# Patient Record
Sex: Female | Born: 1935 | Race: Black or African American | Hispanic: No | Smoking: Never smoker
Health system: Southern US, Community
[De-identification: ages and names within clinical notes are randomized; demographics above are authoritative.]

## PROBLEM LIST (undated history)

## (undated) DIAGNOSIS — H9190 Unspecified hearing loss, unspecified ear: Secondary | ICD-10-CM

## (undated) DIAGNOSIS — E039 Hypothyroidism, unspecified: Secondary | ICD-10-CM

## (undated) DIAGNOSIS — M810 Age-related osteoporosis without current pathological fracture: Secondary | ICD-10-CM

## (undated) DIAGNOSIS — E785 Hyperlipidemia, unspecified: Secondary | ICD-10-CM

## (undated) DIAGNOSIS — I441 Atrioventricular block, second degree: Secondary | ICD-10-CM

## (undated) DIAGNOSIS — M1990 Unspecified osteoarthritis, unspecified site: Secondary | ICD-10-CM

## (undated) DIAGNOSIS — R269 Unspecified abnormalities of gait and mobility: Secondary | ICD-10-CM

## (undated) HISTORY — DX: Unspecified abnormalities of gait and mobility: R26.9

## (undated) HISTORY — DX: Hypothyroidism, unspecified: E03.9

## (undated) HISTORY — DX: Age-related osteoporosis without current pathological fracture: M81.0

## (undated) HISTORY — DX: Unspecified osteoarthritis, unspecified site: M19.90

## (undated) HISTORY — DX: Unspecified hearing loss, unspecified ear: H91.90

## (undated) HISTORY — DX: Hyperlipidemia, unspecified: E78.5

---

## 2013-05-22 ENCOUNTER — Non-Acute Institutional Stay (SKILLED_NURSING_FACILITY): Payer: 59 | Admitting: Internal Medicine

## 2013-05-22 ENCOUNTER — Encounter: Payer: Self-pay | Admitting: Internal Medicine

## 2013-05-22 DIAGNOSIS — M064 Inflammatory polyarthropathy: Secondary | ICD-10-CM

## 2013-05-22 DIAGNOSIS — F32A Depression, unspecified: Secondary | ICD-10-CM | POA: Insufficient documentation

## 2013-05-22 DIAGNOSIS — R269 Unspecified abnormalities of gait and mobility: Secondary | ICD-10-CM

## 2013-05-22 DIAGNOSIS — K219 Gastro-esophageal reflux disease without esophagitis: Secondary | ICD-10-CM

## 2013-05-22 DIAGNOSIS — E039 Hypothyroidism, unspecified: Secondary | ICD-10-CM

## 2013-05-22 DIAGNOSIS — M1990 Unspecified osteoarthritis, unspecified site: Secondary | ICD-10-CM

## 2013-05-22 DIAGNOSIS — M810 Age-related osteoporosis without current pathological fracture: Secondary | ICD-10-CM

## 2013-05-22 DIAGNOSIS — H9190 Unspecified hearing loss, unspecified ear: Secondary | ICD-10-CM | POA: Insufficient documentation

## 2013-05-22 DIAGNOSIS — F3289 Other specified depressive episodes: Secondary | ICD-10-CM

## 2013-05-22 DIAGNOSIS — F329 Major depressive disorder, single episode, unspecified: Secondary | ICD-10-CM

## 2013-05-22 DIAGNOSIS — E785 Hyperlipidemia, unspecified: Secondary | ICD-10-CM | POA: Insufficient documentation

## 2013-05-22 NOTE — Progress Notes (Signed)
Patient ID: Morgan Christensen, female   DOB: 07-23-1914, 80 y.o.   MRN: [id number]     [facility]  PCP: No primary provider on file.  Code Status: full code  Allergies  Allergen Reactions  . Aspirin   . Shellfish-Derived Therapist, sportsroducts     Chief Complaint: new admission  HPI:  80 y/o female patient is here for long term care. She has history of abnormal gait, osteoporosis, hyperlipidemia among others. She is alert and oriented and in no distress. She is sitting on her chair. She complaints of constipation with last bowel movement 3 days back. She denies any other complaints  Review of Systems:  Constitutional: Negative for fever, chills, weight loss, malaise/fatigue and diaphoresis.  HENT: Negative for congestion, hearing loss and sore throat.   Eyes: Negative for eye pain, blurred vision, double vision and discharge.  Respiratory: Negative for cough, sputum production, shortness of breath and wheezing.   Cardiovascular: Negative for chest pain, palpitations, orthopnea and leg swelling.  Gastrointestinal: Negative for heartburn, nausea, vomiting, abdominal pain Genitourinary: Negative for dysuria, urgency, frequency, hematuria and flank pain.  Musculoskeletal: Negative for back pain, falls, joint pain and myalgias.  Skin: Negative for itching and rash.  Neurological: Negative for weakness,dizziness, tingling, focal weakness and headaches.  Psychiatric/Behavioral: Negative for depression.The patient is not nervous/anxious.     Past Medical History  Diagnosis Date  . Osteoporosis   . Hypothyroidism   . Hyperlipidemia   . Inflammatory arthropathy   . Hearing loss   . Gait abnormality    History reviewed. No pertinent past surgical history. Social History:   reports that she has never smoked. She does not have any smokeless tobacco history on file. She reports that she does not drink alcohol or use illicit drugs.  History reviewed. No pertinent family  history.  Medications: Patient's Medications  New Prescriptions   No medications on file  Previous Medications   CALCIUM-VITAMIN D (OSCAL WITH D) 500-200 MG-UNIT PER TABLET    Take 1 tablet by mouth.   CLOPIDOGREL (PLAVIX) 75 MG TABLET    Take 75 mg by mouth daily with breakfast.   DEXTROMETHORPHAN 15 MG/5ML SYRUP    Take 10 mLs by mouth 4 (four) times daily as needed for cough.   DICLOFENAC (FLECTOR) 1.3 % PTCH    Place 1 patch onto the skin 2 (two) times daily.   DOCUSATE CALCIUM (SURFAK) 240 MG CAPSULE    Take 240 mg by mouth daily.   HYDROCODONE-ACETAMINOPHEN (NORCO/VICODIN) 5-325 MG PER TABLET    Take 1 tablet by mouth every 6 (six) hours as needed for moderate pain.   LEVOTHYROXINE (SYNTHROID, LEVOTHROID) 25 MCG TABLET    Take 25 mcg by mouth daily before breakfast.   MAGNESIUM HYDROXIDE (MILK OF MAGNESIA) 400 MG/5ML SUSPENSION    Take 30 mLs by mouth daily as needed for mild constipation.   MOMETASONE (NASONEX) 50 MCG/ACT NASAL SPRAY    Place 2 sprays into the nose daily.   PANTOPRAZOLE (PROTONIX) 40 MG TABLET    Take 40 mg by mouth daily.   SERTRALINE (ZOLOFT) 25 MG TABLET    Take 25 mg by mouth daily.   SIMVASTATIN (ZOCOR) 20 MG TABLET    Take 20 mg by mouth daily.   TRAMADOL (ULTRAM) 50 MG TABLET    Take by mouth every 6 (six) hours as needed.   TRIAMCINOLONE CREAM (KENALOG) 0.1 %    Apply 1 application topically 2 (two) times daily.  Modified  Medications   No medications on file  Discontinued Medications   No medications on file     Physical Exam: Vital signs stable, afebrile General- elderly female in no acute distress Head- atraumatic, normocephalic Eyes- PERRLA, EOMI, no pallor, no icterus, no discharge Neck- no lymphadenopathy, no thyromegaly, no jugular vein distension Nose- normal nasal mucosa, no maxillary or frontal sinus tenderness Cardiovascular- normal s1,s2, no murmurs/ rubs/ gallops Respiratory- bilateral clear to auscultation, no wheeze, no rhonchi, no  crackles, no use of accessory muscles Abdomen- bowel sounds present, soft, non tender, no CVA tenderness Musculoskeletal- able to move all 4 extremities, unsteady gait, no leg edema Neurological- no focal deficit Skin- warm and dry Psychiatry- alert and oriented to person, place with normal mood and affect   Labs reviewed: None available for review  Assessment/Plan  Gait abnormality- likely from her progressive arthritis and osteoporosis. Fall precautions. Continue pain medication, exercise as tolerated  Inflammatory arthropathy- stable at present. Continue flector patch and prn norco  Constipation- present. Docusate and milk of magnesia anre not helping her as desired. Will d/c milk of magnesia. Will add miralax 17 g po daily for now. Monitor bowel movement  Hypothyroidism- continue levothyroxine and check tsh  GERD- continue protonix 40 mg daily for now  Hyperlipidemia- continue zocor, check lipid panel  Osteoporosis- fall precautions. Continue oscal  Depression- stable. Continue zoloft 25 mg daily for now  Family/ staff Communication: reviewed care plan with patient and nursing supervisor   Goals of care: long term care   Labs/tests ordered: cbc, cmp, tsh, lipid panel    Oneal Grout, MD  [facility] [phone] (Monday-Friday 8 am - 5 pm) [phone] (afterhours)

## 2013-06-23 ENCOUNTER — Non-Acute Institutional Stay (SKILLED_NURSING_FACILITY): Payer: 59 | Admitting: Internal Medicine

## 2013-06-23 DIAGNOSIS — K5900 Constipation, unspecified: Secondary | ICD-10-CM

## 2013-06-23 DIAGNOSIS — E039 Hypothyroidism, unspecified: Secondary | ICD-10-CM

## 2013-06-23 DIAGNOSIS — E785 Hyperlipidemia, unspecified: Secondary | ICD-10-CM

## 2013-06-23 DIAGNOSIS — K219 Gastro-esophageal reflux disease without esophagitis: Secondary | ICD-10-CM

## 2013-06-23 NOTE — Progress Notes (Signed)
         PROGRESS NOTE  DATE: 06/23/2013  FACILITY: Nursing Home Location: [facility]  LEVEL OF CARE: SNF (31)  Routine Visit  CHIEF COMPLAINT:  Manage hypothyroidism, hyperlipidemia and constipation  HISTORY OF PRESENT ILLNESS:  REASSESSMENT OF ONGOING PROBLEM(S):  HYPERLIPIDEMIA: No complications from the medications presently being used. In 4-15 HDL 29, LDL 101 otherwise fasting lipid panel normal.  CONSTIPATION: The constipation remains stable. No complications from the medications presently being used. Patient denies ongoing constipation, abdominal pain, nausea or vomiting.  HYPOTHYROIDISM: The hypothyroidism remains stable. No complications noted from the medications presently being used.  The patient denies fatigue or constipation.  Last TSH 6.43 in 4-15.  PAST MEDICAL HISTORY : Reviewed.  No changes/see problem list  CURRENT MEDICATIONS: Reviewed per MAR/see medication list  REVIEW OF SYSTEMS:  GENERAL: no change in appetite, no fatigue, no weight changes, no fever, chills or weakness RESPIRATORY: no cough, SOB, DOE, wheezing, hemoptysis CARDIAC: no chest pain, edema or palpitations GI: no abdominal pain, diarrhea, constipation, heart burn, nausea or vomiting  PHYSICAL EXAMINATION  VS:  See VS section  GENERAL: no acute distress, normal body habitus EYES: conjunctivae normal, sclerae normal, normal eye lids NECK: supple, trachea midline, no neck masses, no thyroid tenderness, no thyromegaly LYMPHATICS: no LAN in the neck, no supraclavicular LAN RESPIRATORY: breathing is even & unlabored, BS CTAB CARDIAC: RRR, no murmur,no extra heart sounds, no edema GI: abdomen soft, normal BS, no masses, no tenderness, no hepatomegaly, no splenomegaly PSYCHIATRIC: the patient is alert & oriented to person, affect & behavior appropriate  LABS/RADIOLOGY:  4-15 MCV 73, platelets 111, hemoglobin 11.9, WBC 8.7, glucose 102 otherwise CMP  normal  ASSESSMENT/PLAN:  Hypothyroidism-TSH slightly elevated. Recheck TSH in 6 weeks. Hyperlipidemia-well controlled Constipation-well controlled GERD-stable Allergic rhinitis-well controlled Depression-continue Zoloft Microcytosis-check iron studies  CPT CODE: 4098199309  Angela CoxGayani Y Dasanayaka, MD [facility] [phone]

## 2013-07-21 ENCOUNTER — Non-Acute Institutional Stay (SKILLED_NURSING_FACILITY): Payer: 59 | Admitting: Internal Medicine

## 2013-07-21 DIAGNOSIS — K5900 Constipation, unspecified: Secondary | ICD-10-CM

## 2013-07-21 DIAGNOSIS — K219 Gastro-esophageal reflux disease without esophagitis: Secondary | ICD-10-CM

## 2013-07-21 DIAGNOSIS — E785 Hyperlipidemia, unspecified: Secondary | ICD-10-CM

## 2013-07-21 DIAGNOSIS — E039 Hypothyroidism, unspecified: Secondary | ICD-10-CM

## 2013-07-21 NOTE — Progress Notes (Signed)
                PROGRESS NOTE  DATE: 07-21-13  FACILITY: Nursing Home Location: [facility]  LEVEL OF CARE: SNF (31)  Routine Visit  CHIEF COMPLAINT:  Manage hypothyroidism, hyperlipidemia and constipation  HISTORY OF PRESENT ILLNESS:  REASSESSMENT OF ONGOING PROBLEM(S):  HYPERLIPIDEMIA: No complications from the medications presently being used. In 4-15 HDL 29, LDL 101 otherwise fasting lipid panel normal.  CONSTIPATION: The constipation remains stable. No complications from the medications presently being used. Patient denies ongoing constipation, abdominal pain, nausea or vomiting.  HYPOTHYROIDISM: The hypothyroidism remains stable. No complications noted from the medications presently being used.  The patient denies fatigue or constipation.  Last TSH 6.43 in 4-15.  PAST MEDICAL HISTORY : Reviewed.  No changes/see problem list  CURRENT MEDICATIONS: Reviewed per MAR/see medication list  REVIEW OF SYSTEMS:  GENERAL: no change in appetite, no fatigue, no weight changes, no fever, chills or weakness RESPIRATORY: no cough, SOB, DOE, wheezing, hemoptysis CARDIAC: no chest pain, edema or palpitations GI: no abdominal pain, diarrhea, constipation, heart burn, nausea or vomiting  PHYSICAL EXAMINATION  VS:  See VS section  GENERAL: no acute distress, normal body habitus NECK: supple, trachea midline, no neck masses, no thyroid tenderness, no thyromegaly RESPIRATORY: breathing is even & unlabored, BS CTAB CARDIAC: RRR, no murmur,no extra heart sounds, no edema GI: abdomen soft, normal BS, no masses, no tenderness, no hepatomegaly, no splenomegaly PSYCHIATRIC: the patient is alert & oriented to person, affect & behavior appropriate  LABS/RADIOLOGY: 5-15 date in 2 or 9, TIBC 322 ,serum iron 92, percent saturation 29 4-15 MCV 73, platelets 111, hemoglobin 11.9, WBC 8.7, glucose 102 otherwise CMP normal  ASSESSMENT/PLAN:  Hypothyroidism-TSH slightly elevated.  Recheck TSH in 6 weeks pending. Hyperlipidemia-well controlled Constipation-well controlled GERD-stable Allergic rhinitis-well controlled Depression-continue Zoloft Microcytosis-no iron deficiency  CPT CODE: 41364  Angela Cox, MD [facility] [phone]

## 2013-08-03 ENCOUNTER — Encounter (HOSPITAL_COMMUNITY): Payer: Self-pay | Admitting: Emergency Medicine

## 2013-08-03 ENCOUNTER — Inpatient Hospital Stay (HOSPITAL_COMMUNITY)
Admission: EM | Admit: 2013-08-03 | Discharge: 2013-08-06 | DRG: 244 | Disposition: A | Discharge status: Skilled Nursing Facility | Payer: PRIVATE HEALTH INSURANCE | Attending: Internal Medicine | Admitting: Internal Medicine

## 2013-08-03 ENCOUNTER — Inpatient Hospital Stay (HOSPITAL_COMMUNITY): Payer: PRIVATE HEALTH INSURANCE

## 2013-08-03 DIAGNOSIS — Z66 Do not resuscitate: Secondary | ICD-10-CM | POA: Diagnosis present

## 2013-08-03 DIAGNOSIS — D696 Thrombocytopenia, unspecified: Secondary | ICD-10-CM | POA: Diagnosis present

## 2013-08-03 DIAGNOSIS — E785 Hyperlipidemia, unspecified: Secondary | ICD-10-CM

## 2013-08-03 DIAGNOSIS — I441 Atrioventricular block, second degree: Secondary | ICD-10-CM | POA: Diagnosis present

## 2013-08-03 DIAGNOSIS — F329 Major depressive disorder, single episode, unspecified: Secondary | ICD-10-CM | POA: Diagnosis present

## 2013-08-03 DIAGNOSIS — S0180XA Unspecified open wound of other part of head, initial encounter: Secondary | ICD-10-CM

## 2013-08-03 DIAGNOSIS — S0003XA Contusion of scalp, initial encounter: Secondary | ICD-10-CM | POA: Diagnosis present

## 2013-08-03 DIAGNOSIS — F3289 Other specified depressive episodes: Secondary | ICD-10-CM | POA: Diagnosis present

## 2013-08-03 DIAGNOSIS — I498 Other specified cardiac arrhythmias: Secondary | ICD-10-CM | POA: Diagnosis present

## 2013-08-03 DIAGNOSIS — Y9210 Unspecified residential institution as the place of occurrence of the external cause: Secondary | ICD-10-CM | POA: Diagnosis present

## 2013-08-03 DIAGNOSIS — M810 Age-related osteoporosis without current pathological fracture: Secondary | ICD-10-CM | POA: Diagnosis present

## 2013-08-03 DIAGNOSIS — S0083XA Contusion of other part of head, initial encounter: Secondary | ICD-10-CM | POA: Diagnosis present

## 2013-08-03 DIAGNOSIS — E039 Hypothyroidism, unspecified: Secondary | ICD-10-CM | POA: Diagnosis present

## 2013-08-03 DIAGNOSIS — S0120XA Unspecified open wound of nose, initial encounter: Secondary | ICD-10-CM | POA: Diagnosis present

## 2013-08-03 DIAGNOSIS — D649 Anemia, unspecified: Secondary | ICD-10-CM | POA: Diagnosis present

## 2013-08-03 DIAGNOSIS — W010XXA Fall on same level from slipping, tripping and stumbling without subsequent striking against object, initial encounter: Secondary | ICD-10-CM | POA: Diagnosis present

## 2013-08-03 DIAGNOSIS — I10 Essential (primary) hypertension: Secondary | ICD-10-CM

## 2013-08-03 DIAGNOSIS — S0181XA Laceration without foreign body of other part of head, initial encounter: Secondary | ICD-10-CM

## 2013-08-03 DIAGNOSIS — Z950 Presence of cardiac pacemaker: Secondary | ICD-10-CM

## 2013-08-03 DIAGNOSIS — K219 Gastro-esophageal reflux disease without esophagitis: Secondary | ICD-10-CM

## 2013-08-03 DIAGNOSIS — K5900 Constipation, unspecified: Secondary | ICD-10-CM

## 2013-08-03 DIAGNOSIS — F32A Depression, unspecified: Secondary | ICD-10-CM

## 2013-08-03 DIAGNOSIS — S1093XA Contusion of unspecified part of neck, initial encounter: Secondary | ICD-10-CM

## 2013-08-03 DIAGNOSIS — R001 Bradycardia, unspecified: Secondary | ICD-10-CM | POA: Insufficient documentation

## 2013-08-03 HISTORY — DX: Atrioventricular block, second degree: I44.1

## 2013-08-03 LAB — I-STAT CHEM 8, ED
BUN: 13 mg/dL (ref 6–23)
Calcium, Ion: 1.26 mmol/L (ref 1.13–1.30)
Chloride: 101 mEq/L (ref 96–112)
Creatinine, Ser: 0.7 mg/dL (ref 0.50–1.10)
Glucose, Bld: 109 mg/dL — ABNORMAL HIGH (ref 70–99)
HEMATOCRIT: 39 % (ref 36.0–46.0)
HEMOGLOBIN: 13.3 g/dL (ref 12.0–15.0)
Potassium: 4.2 mEq/L (ref 3.7–5.3)
SODIUM: 139 meq/L (ref 137–147)
TCO2: 24 mmol/L (ref 0–100)

## 2013-08-03 LAB — COMPREHENSIVE METABOLIC PANEL
ALBUMIN: 4 g/dL (ref 3.5–5.2)
ALK PHOS: 59 U/L (ref 39–117)
ALT: 8 U/L (ref 0–35)
AST: 17 U/L (ref 0–37)
BUN: 14 mg/dL (ref 6–23)
CO2: 24 mEq/L (ref 19–32)
Calcium: 9.7 mg/dL (ref 8.4–10.5)
Chloride: 102 mEq/L (ref 96–112)
Creatinine, Ser: 0.66 mg/dL (ref 0.50–1.10)
GFR calc Af Amer: 84 mL/min — ABNORMAL LOW (ref >90)
GFR calc non Af Amer: 72 mL/min — ABNORMAL LOW (ref >90)
Glucose, Bld: 107 mg/dL — ABNORMAL HIGH (ref 70–99)
POTASSIUM: 4.6 meq/L (ref 3.7–5.3)
SODIUM: 140 meq/L (ref 137–147)
TOTAL PROTEIN: 7 g/dL (ref 6.0–8.3)
Total Bilirubin: <0.2 mg/dL — ABNORMAL LOW (ref 0.3–1.2)

## 2013-08-03 LAB — TROPONIN I: Troponin I: <0.3 ng/mL (ref <0.30)

## 2013-08-03 LAB — CBC WITH DIFFERENTIAL/PLATELET
BASOS PCT: 0 % (ref 0–1)
Basophils Absolute: 0 10*3/uL (ref 0.0–0.1)
EOS ABS: 0.1 10*3/uL (ref 0.0–0.7)
Eosinophils Relative: 1 % (ref 0–5)
HCT: 38.1 % (ref 36.0–46.0)
HEMOGLOBIN: 11.4 g/dL — AB (ref 12.0–15.0)
Lymphocytes Relative: 19 % (ref 12–46)
Lymphs Abs: 1.6 10*3/uL (ref 0.7–4.0)
MCH: 23.1 pg — AB (ref 26.0–34.0)
MCHC: 29.9 g/dL — AB (ref 30.0–36.0)
MCV: 77.3 fL — AB (ref 78.0–100.0)
MONO ABS: 0.5 10*3/uL (ref 0.1–1.0)
MONOS PCT: 6 % (ref 3–12)
Neutro Abs: 6.2 10*3/uL (ref 1.7–7.7)
Neutrophils Relative %: 73 % (ref 43–77)
Platelets: 107 10*3/uL — ABNORMAL LOW (ref 150–400)
RBC: 4.93 MIL/uL (ref 3.87–5.11)
RDW: 14.7 % (ref 11.5–15.5)
WBC: 8.4 10*3/uL (ref 4.0–10.5)

## 2013-08-03 LAB — URINALYSIS, ROUTINE W REFLEX MICROSCOPIC
Bilirubin Urine: NEGATIVE
GLUCOSE, UA: NEGATIVE mg/dL
HGB URINE DIPSTICK: NEGATIVE
Ketones, ur: NEGATIVE mg/dL
Nitrite: NEGATIVE
PROTEIN: NEGATIVE mg/dL
Specific Gravity, Urine: 1.012 (ref 1.005–1.030)
Urobilinogen, UA: 0.2 mg/dL (ref 0.0–1.0)
pH: 5.5 (ref 5.0–8.0)

## 2013-08-03 LAB — URINE MICROSCOPIC-ADD ON

## 2013-08-03 LAB — MRSA PCR SCREENING: MRSA by PCR: NEGATIVE

## 2013-08-03 LAB — I-STAT TROPONIN, ED: Troponin i, poc: 0.01 ng/mL (ref 0.00–0.08)

## 2013-08-03 MED ORDER — SERTRALINE HCL 25 MG PO TABS
25.0000 mg | ORAL_TABLET | Freq: Every day | ORAL | Status: DC
  Administered 2013-08-04 – 2013-08-06 (×3): 25 mg via ORAL
  Filled 2013-08-03 (×3): qty 1

## 2013-08-03 MED ORDER — POLYETHYLENE GLYCOL 3350 17 G PO PACK
17.0000 g | PACK | Freq: Every day | ORAL | Status: DC
  Administered 2013-08-04 – 2013-08-06 (×2): 17 g via ORAL
  Filled 2013-08-03 (×3): qty 1

## 2013-08-03 MED ORDER — SODIUM CHLORIDE 0.9 % IJ SOLN
3.0000 mL | Freq: Two times a day (BID) | INTRAMUSCULAR | Status: DC
  Administered 2013-08-04 – 2013-08-05 (×4): 3 mL via INTRAVENOUS
  Administered 2013-08-06: 11:00:00 via INTRAVENOUS

## 2013-08-03 MED ORDER — LEVOTHYROXINE SODIUM 25 MCG PO TABS
25.0000 ug | ORAL_TABLET | Freq: Every day | ORAL | Status: DC
  Administered 2013-08-04 – 2013-08-06 (×3): 25 ug via ORAL
  Filled 2013-08-03 (×5): qty 1

## 2013-08-03 MED ORDER — SODIUM CHLORIDE 0.9 % IV SOLN
INTRAVENOUS | Status: DC
  Administered 2013-08-03: 10 mL/h via INTRAVENOUS

## 2013-08-03 MED ORDER — LIDOCAINE-EPINEPHRINE 2 %-1:100000 IJ SOLN
20.0000 mL | Freq: Once | INTRAMUSCULAR | Status: DC
  Filled 2013-08-03: qty 20

## 2013-08-03 MED ORDER — ACETAMINOPHEN 325 MG PO TABS: 650.0000 mg | ORAL_TABLET | Freq: Four times a day (QID) | ORAL | Status: DC | PRN

## 2013-08-03 MED ORDER — DOCUSATE SODIUM 100 MG PO CAPS
200.0000 mg | ORAL_CAPSULE | Freq: Every day | ORAL | Status: DC
  Administered 2013-08-04 – 2013-08-05 (×2): 200 mg via ORAL
  Filled 2013-08-03 (×3): qty 2

## 2013-08-03 MED ORDER — TRAMADOL HCL 50 MG PO TABS: 50.0000 mg | ORAL_TABLET | ORAL | Status: DC | PRN

## 2013-08-03 MED ORDER — SIMVASTATIN 20 MG PO TABS
20.0000 mg | ORAL_TABLET | Freq: Every day | ORAL | Status: DC
  Administered 2013-08-03 – 2013-08-05 (×2): 20 mg via ORAL
  Filled 2013-08-03 (×4): qty 1

## 2013-08-03 MED ORDER — SODIUM CHLORIDE 0.9 % IV BOLUS (SEPSIS)
500.0000 mL | Freq: Once | INTRAVENOUS | Status: AC
  Administered 2013-08-03: 500 mL via INTRAVENOUS

## 2013-08-03 MED ORDER — CALCIUM CARBONATE-VITAMIN D 500-200 MG-UNIT PO TABS
1.0000 | ORAL_TABLET | Freq: Every day | ORAL | Status: DC
  Administered 2013-08-04 – 2013-08-06 (×3): 1 via ORAL
  Filled 2013-08-03 (×5): qty 1

## 2013-08-03 MED ORDER — ONDANSETRON HCL 4 MG/2ML IJ SOLN: 4.0000 mg | Freq: Four times a day (QID) | INTRAMUSCULAR | Status: DC | PRN

## 2013-08-03 MED ORDER — PANTOPRAZOLE SODIUM 40 MG PO TBEC
40.0000 mg | DELAYED_RELEASE_TABLET | Freq: Every day | ORAL | Status: DC
  Administered 2013-08-04 – 2013-08-06 (×3): 40 mg via ORAL
  Filled 2013-08-03 (×2): qty 1

## 2013-08-03 MED ORDER — HYDROCODONE-ACETAMINOPHEN 5-325 MG PO TABS: 1.0000 | ORAL_TABLET | Freq: Four times a day (QID) | ORAL | Status: DC | PRN

## 2013-08-03 MED ORDER — ACETAMINOPHEN 650 MG RE SUPP: 650.0000 mg | Freq: Four times a day (QID) | RECTAL | Status: DC | PRN

## 2013-08-03 MED ORDER — FLUTICASONE PROPIONATE 50 MCG/ACT NA SUSP
2.0000 | Freq: Every day | NASAL | Status: DC
  Administered 2013-08-04 – 2013-08-06 (×3): 2 via NASAL
  Filled 2013-08-03 (×2): qty 16

## 2013-08-03 MED ORDER — ONDANSETRON HCL 4 MG PO TABS: 4.0000 mg | ORAL_TABLET | Freq: Four times a day (QID) | ORAL | Status: DC | PRN

## 2013-08-03 NOTE — ED Notes (Signed)
During nursing exam pt HR went to 31 with possible complete heart block, Dr. Jodi MourningZavitz at bedside and EKG shot, given to Dr. Jodi MourningZavitz.

## 2013-08-03 NOTE — ED Notes (Signed)
Per EMS: Pt arrives from [facility] for eval of fall today while pt was trying to get up from the toilet. Pt fell and hit right side of face on the railing, hematoma and swelling noted to right eye and nose, laceration also noted to nose, bleeding controlled. Upon arrival pt noted to have HR of 24 on the monitor. En route, pt HR in between 36-60, asymptomatic. nad noted, axo x4. Pt has hearing aid in right ear. Skin warm and dry.

## 2013-08-03 NOTE — ED Provider Notes (Signed)
CSN: [id number]     Arrival date & time 08/03/13  1820 History   First MD Initiated Contact with Patient 08/03/13 1825     Chief Complaint  Patient presents with  . Fall  . Bradycardia     (Consider location/radiation/quality/duration/timing/severity/associated sxs/prior Treatment) Patient is a 80 y.o. female presenting with fall.  Fall This is a new problem. The current episode started today. The problem has been resolved. Pertinent negatives include no abdominal pain, chest pain, chills, congestion, coughing, fever, headaches, joint swelling, nausea, rash, vertigo, vomiting or weakness. Nothing aggravates the symptoms. She has tried nothing for the symptoms.    Past Medical History  Diagnosis Date  . Osteoporosis   . Hypothyroidism   . Hyperlipidemia   . Inflammatory arthropathy   . Hearing loss   . Gait abnormality    History reviewed. No pertinent past surgical history. No family history on file. History  Substance Use Topics  . Smoking status: Never Smoker   . Smokeless tobacco: Not on file  . Alcohol Use: No   OB History   Grav Para Term Preterm Abortions TAB SAB Ect Mult Living                 Review of Systems  Constitutional: Negative for fever, chills and activity change.  HENT: Negative for congestion and facial swelling.        Face pain  Eyes: Negative for discharge and redness.  Respiratory: Negative for cough and shortness of breath.   Cardiovascular: Negative for chest pain and palpitations.  Gastrointestinal: Negative for nausea, vomiting, abdominal pain and abdominal distention.  Endocrine: Negative for polydipsia and polyuria.  Genitourinary: Negative for dysuria and menstrual problem.  Musculoskeletal: Negative for back pain and joint swelling.  Skin: Negative for color change, rash and wound.  Neurological: Negative for dizziness, vertigo, weakness, light-headedness and headaches.      Allergies  Aspirin and Shellfish-derived  products  Home Medications   Prior to Admission medications   Medication Sig Start Date End Date Taking? Authorizing Provider  calcium-vitamin D (OSCAL WITH D) 500-200 MG-UNIT per tablet Take 1 tablet by mouth.    Historical Provider, MD  clopidogrel (PLAVIX) 75 MG tablet Take 75 mg by mouth daily with breakfast.    Historical Provider, MD  dextromethorphan 15 MG/5ML syrup Take 10 mLs by mouth 4 (four) times daily as needed for cough.    Historical Provider, MD  diclofenac (FLECTOR) 1.3 % PTCH Place 1 patch onto the skin 2 (two) times daily.    Historical Provider, MD  docusate calcium (SURFAK) 240 MG capsule Take 240 mg by mouth daily.    Historical Provider, MD  HYDROcodone-acetaminophen (NORCO/VICODIN) 5-325 MG per tablet Take 1 tablet by mouth every 6 (six) hours as needed for moderate pain.    Historical Provider, MD  levothyroxine (SYNTHROID, LEVOTHROID) 25 MCG tablet Take 25 mcg by mouth daily before breakfast.    Historical Provider, MD  magnesium hydroxide (MILK OF MAGNESIA) 400 MG/5ML suspension Take 30 mLs by mouth daily as needed for mild constipation.    Historical Provider, MD  mometasone (NASONEX) 50 MCG/ACT nasal spray Place 2 sprays into the nose daily.    Historical Provider, MD  pantoprazole (PROTONIX) 40 MG tablet Take 40 mg by mouth daily.    Historical Provider, MD  sertraline (ZOLOFT) 25 MG tablet Take 25 mg by mouth daily.    Historical Provider, MD  simvastatin (ZOCOR) 20 MG tablet Take 20 mg by mouth daily.  Historical Provider, MD  traMADol (ULTRAM) 50 MG tablet Take by mouth every 6 (six) hours as needed.    Historical Provider, MD  triamcinolone cream (KENALOG) 0.1 % Apply 1 application topically 2 (two) times daily.    Historical Provider, MD   BP 163/60  Pulse 53  Temp(Src) 98.1 F (36.7 C) (Oral)  Resp 22  SpO2 98% Physical Exam  Nursing note and vitals reviewed. Constitutional: She is oriented to person, place, and time. She appears well-developed and  well-nourished.  HENT:  Head: Normocephalic and atraumatic.  Eyes: Conjunctivae and EOM are normal. Right eye exhibits no discharge. Left eye exhibits no discharge.  Cardiovascular: Regular rhythm.  Bradycardia present.   Pulmonary/Chest: Effort normal and breath sounds normal. No respiratory distress.  Abdominal: Soft. She exhibits no distension. There is no tenderness. There is no rebound.  Musculoskeletal: Normal range of motion. She exhibits no edema and no tenderness.  Neurological: She is alert and oriented to person, place, and time.  Skin: Skin is warm and dry.  Laceration to right nasal crease    ED Course  LACERATION REPAIR Date/Time: 08/04/2013 12:16 AM Performed by: Marily MemosMESNER, Ulyess Muto Authorized by: Marily MemosMESNER, Drea Jurewicz Consent: Verbal consent obtained. Risks and benefits: risks, benefits and alternatives were discussed Consent given by: patient Patient understanding: patient states understanding of the procedure being performed Body area: head/neck Location details: nose Laceration length: 2 cm Tendon involvement: none Nerve involvement: none Vascular damage: no Local anesthetic: lidocaine 2% with epinephrine Anesthetic total: 3 ml Preparation: Patient was prepped and draped in the usual sterile fashion. Irrigation solution: saline Amount of cleaning: standard Debridement: none Degree of undermining: none Skin closure: 6-0 Prolene Number of sutures: 4 Technique: simple Approximation: close Approximation difficulty: complex Patient tolerance: Patient tolerated the procedure well with no immediate complications.   (including critical care time) Labs Review Labs Reviewed - No data to display  Imaging Review No results found.   EKG Interpretation None      MDM   Final diagnoses:  None    80 yo F w/ h/o bradycardia, depression, HLD, hypothyroidism presents to ED after a fall and laceration to face. Found to be brady but asymptomatic. ecg appears to be complete  v. 2:1 heart block. bp on high side, no other symptoms. No syncope, large ecchymosis and small laceration on face. Spoke with cardiology and wants medicine obs admission for what appears to be type ii second degree heart block. Lac repaired as above. Ct done and negative. Admitted to medicien.     Marily MemosJason Wray Goehring, MD 08/04/13 [phone]

## 2013-08-03 NOTE — H&P (Addendum)
[facility] History and Physical  Morgan [place] Wormley ZOX:[id number] DOB: 02-Jul-1914 DOA: 08/03/2013  Referring physician: ER physician. PCP: No primary provider on file. patient was just recently moved from [place].  Chief Complaint: Fall.  HPI: Morgan Christensen is a [age] y.o. female with history of hypothyroidism hyperlipidemia had a fall at her nursing home. Patient states that she saw some bug in the bathroom and was trying to avoid it when she slipped and fell. She hit her face and developed a right periorbital hematoma and also a laceration of the right nares. This was sutured in the ER. CT head and maxillofacial is pending. Patient on exam is nonfocal. Patient's EKG showed second degree AV block type II Mobitz. On-call cardiologist Dr. Jacinto HalimGanji, was consulted and at this time family was stating that patient has had this before. Per Dr. Jacinto HalimGanji patient is to be admitted and observed. Patient otherwise denies any chest pain nausea vomiting abdominal pain diarrhea. Patient denies any visual symptoms and is able to move her eyes without any restriction.   Review of Systems: As presented in the history of presenting illness, rest negative.  Past Medical History  Diagnosis Date  . Osteoporosis   . Hypothyroidism   . Hyperlipidemia   . Inflammatory arthropathy   . Hearing loss   . Gait abnormality    History reviewed. No pertinent past surgical history. Social History:  reports that she has never smoked. She does not have any smokeless tobacco history on file. She reports that she does not drink alcohol or use illicit drugs. Where does patient live nursing home. Can patient participate in ADLs? Not sure.  Allergies  Allergen Reactions  . Aspirin Nausea And Vomiting  . Shellfish-Derived Products Nausea And Vomiting    Family History: History reviewed. No pertinent family history.    Prior to Admission medications   Medication Sig Start Date End Date Taking? Authorizing Provider   calcium-vitamin D (OSCAL WITH D) 500-200 MG-UNIT per tablet Take 1 tablet by mouth daily.    Yes Historical Provider, MD  docusate calcium (SURFAK) 240 MG capsule Take 240 mg by mouth at bedtime.    Yes Historical Provider, MD  HYDROcodone-acetaminophen (NORCO/VICODIN) 5-325 MG per tablet Take 1 tablet by mouth every 6 (six) hours as needed for moderate pain.   Yes Historical Provider, MD  levothyroxine (SYNTHROID, LEVOTHROID) 25 MCG tablet Take 25 mcg by mouth daily before breakfast.   Yes Historical Provider, MD  mometasone (NASONEX) 50 MCG/ACT nasal spray Place 2 sprays into the nose daily.   Yes Historical Provider, MD  pantoprazole (PROTONIX) 40 MG tablet Take 40 mg by mouth daily before breakfast.    Yes Historical Provider, MD  polyethylene glycol (MIRALAX / GLYCOLAX) packet Take 17 g by mouth daily. Mix in 4-8 oz of liquid and drink   Yes Historical Provider, MD  sertraline (ZOLOFT) 25 MG tablet Take 25 mg by mouth daily.   Yes Historical Provider, MD  simvastatin (ZOCOR) 20 MG tablet Take 20 mg by mouth at bedtime.    Yes Historical Provider, MD  traMADol (ULTRAM) 50 MG tablet Take 50 mg by mouth every 4 (four) hours as needed (pain).    Yes Historical Provider, MD    Physical Exam: Filed Vitals:   08/03/13 1845 08/03/13 1900 08/03/13 1915 08/03/13 1930  BP: 163/60 141/111 136/99 149/57  Pulse: 53 51 52 55  Temp:      TempSrc:      Resp: 22 12 14 17   SpO2: 98%  98% 98% 98%     General:  Well-developed and moderately nourished.  Eyes: Right infraorbital hematoma. Patient is able to move both eyes without any restriction and able to see in both eyes. PERRLA positive.  ENT: Right sided laceration of the nares.  Neck: No mass felt. No neck rigidity.  Cardiovascular: S1-S2 heard.  Respiratory: No rhonchi or crepitations.  Abdomen: Soft nontender bowel sounds present. No guarding rigidity.  Skin: Skin laceration on the face as explained on the ENT  section.  Musculoskeletal: No edema. Able to move all extremities.  Psychiatric: Appears normal.  Neurologic: Alert awake oriented to time place and person. Moves all extremities.  Labs on Admission:  Basic Metabolic Panel:  Recent Labs Lab 08/03/13 1920 08/03/13 1925  NA 140 139  K 4.6 4.2  CL 102 101  CO2 24  --   GLUCOSE 107* 109*  BUN 14 13  CREATININE 0.66 0.70  CALCIUM 9.7  --    Liver Function Tests:  Recent Labs Lab 08/03/13 1920  AST 17  ALT 8  ALKPHOS 59  BILITOT <0.2*  PROT 7.0  ALBUMIN 4.0   No results found for this basename: LIPASE, AMYLASE,  in the last 168 hours No results found for this basename: AMMONIA,  in the last 168 hours CBC:  Recent Labs Lab 08/03/13 1920 08/03/13 1925  WBC 8.4  --   NEUTROABS 6.2  --   HGB 11.4* 13.3  HCT 38.1 39.0  MCV 77.3*  --   PLT 107*  --    Cardiac Enzymes:  Recent Labs Lab 08/03/13 1920  TROPONINI <0.30    BNP (last 3 results) No results found for this basename: PROBNP,  in the last 8760 hours CBG: No results found for this basename: GLUCAP,  in the last 168 hours  Radiological Exams on Admission: No results found.  EKG: Independently reviewed. Subsequent EKG showed second degree AV block Mobitz2.   Assessment/Plan Principal Problem:   Mobitz type 2 second degree atrioventricular block Active Problems:   Hypothyroidism   Hyperlipidemia   Laceration of face   1. Fall with right infraorbital hematoma and facial laceration - facial laceration has been sutured. CT of the orbit and head is pending. Continue to observe. Since patient has infraorbital hematoma we will place patient on SCDs for DVT prophylaxis. 2. Second degree AV block Mobitz type II - Dr. Jacinto HalimGanji, on-call cardiologist aware. Closely observe in step down. As per patient's niece patient has had this before. Patient is not on any beta blockers or rate limiting medications. Check TSH. 3. Hypothyroidism - check  TSH. 4. Hyperlipidemia - on statins. 5. Anemia and thrombocytopenia - closely follow CBC. Baseline counts not known.  Addendum - patient CT head and maxillofacial results showed swelling around the right infraorbital area with gas concerning for infection. There is no fracture as per the CT results. On exam patient is able to move both eyes without any difficulty and is able to see in both eyes. I did call on-call ophthalmologist Dr. Alphonzo LemmingsMcCune. At this time patient will be placed on empiric antibiotics and ophthalmologist will be seeing patient in consult for further recommendations.  Code Status: DO NOT RESUSCITATE.  Family Communication: None.   Disposition Plan: Admit to inpatient.    Allysen Lazo N. [facility] Pager [phone].  If 7PM-7AM, please contact night-coverage [email or web address] Password [email or web address] 08/03/2013, 9:25 PM

## 2013-08-04 DIAGNOSIS — I498 Other specified cardiac arrhythmias: Secondary | ICD-10-CM

## 2013-08-04 DIAGNOSIS — R55 Syncope and collapse: Secondary | ICD-10-CM

## 2013-08-04 LAB — BASIC METABOLIC PANEL
BUN: 12 mg/dL (ref 6–23)
CO2: 22 meq/L (ref 19–32)
CREATININE: 0.61 mg/dL (ref 0.50–1.10)
Calcium: 9.4 mg/dL (ref 8.4–10.5)
Chloride: 100 mEq/L (ref 96–112)
GFR calc Af Amer: 86 mL/min — ABNORMAL LOW (ref >90)
GFR calc non Af Amer: 74 mL/min — ABNORMAL LOW (ref >90)
Glucose, Bld: 87 mg/dL (ref 70–99)
Potassium: 4 mEq/L (ref 3.7–5.3)
Sodium: 138 mEq/L (ref 137–147)

## 2013-08-04 LAB — CBC
HCT: 38.5 % (ref 36.0–46.0)
HEMOGLOBIN: 11.7 g/dL — AB (ref 12.0–15.0)
MCH: 23.4 pg — ABNORMAL LOW (ref 26.0–34.0)
MCHC: 30.4 g/dL (ref 30.0–36.0)
MCV: 76.8 fL — AB (ref 78.0–100.0)
Platelets: 101 10*3/uL — ABNORMAL LOW (ref 150–400)
RBC: 5.01 MIL/uL (ref 3.87–5.11)
RDW: 14.7 % (ref 11.5–15.5)
WBC: 8.6 10*3/uL (ref 4.0–10.5)

## 2013-08-04 LAB — TSH: TSH: 14.27 u[IU]/mL — ABNORMAL HIGH (ref 0.350–4.500)

## 2013-08-04 LAB — T4, FREE: Free T4: 0.82 ng/dL (ref 0.80–1.80)

## 2013-08-04 MED ORDER — PIPERACILLIN-TAZOBACTAM 3.375 G IVPB
3.3750 g | Freq: Three times a day (TID) | INTRAVENOUS | Status: DC
  Administered 2013-08-04: 3.375 g via INTRAVENOUS
  Filled 2013-08-04 (×2): qty 50

## 2013-08-04 MED ORDER — VANCOMYCIN HCL IN DEXTROSE 1-5 GM/200ML-% IV SOLN
1000.0000 mg | Freq: Once | INTRAVENOUS | Status: AC
  Administered 2013-08-04: 1000 mg via INTRAVENOUS
  Filled 2013-08-04: qty 200

## 2013-08-04 MED ORDER — VANCOMYCIN HCL 500 MG IV SOLR: 500.0000 mg | INTRAVENOUS | Status: DC

## 2013-08-04 MED ORDER — TETANUS-DIPHTH-ACELL PERTUSSIS 5-2.5-18.5 LF-MCG/0.5 IM SUSP
0.5000 mL | Freq: Once | INTRAMUSCULAR | Status: AC
  Administered 2013-08-04: 0.5 mL via INTRAMUSCULAR
  Filled 2013-08-04: qty 0.5

## 2013-08-04 NOTE — Progress Notes (Signed)
ANTIBIOTIC CONSULT NOTE - INITIAL  Pharmacy Consult for Vancomycin and Zosyn  Indication: cellulitis  Allergies  Allergen Reactions  . Aspirin Nausea And Vomiting  . Shellfish-Derived Products Nausea And Vomiting    Patient Measurements: Height: 5\' 3"  (160 cm) Weight: 132 lb 4.4 oz (60 kg) IBW/kg (Calculated) : 52.4  Vital Signs: Temp: 96.3 F (35.7 C) (06/14 2200) Temp src: Axillary (06/14 2200) BP: 130/53 mmHg (06/15 0100) Pulse Rate: 61 (06/15 0100) Intake/Output from previous day: 06/14 0701 - 06/15 0700 In: 140 [P.O.:120; I.V.:20] Out: 125 [Urine:125] Intake/Output from this shift: Total I/O In: 140 [P.O.:120; I.V.:20] Out: 125 [Urine:125]  Labs:  Recent Labs  08/03/13 1920 08/03/13 1925  WBC 8.4  --   HGB 11.4* 13.3  PLT 107*  --   CREATININE 0.66 0.70   Estimated Creatinine Clearance: 34 ml/min (by C-G formula based on Cr of 0.7). No results found for this basename: VANCOTROUGH, Leodis BinetVANCOPEAK, VANCORANDOM, GENTTROUGH, GENTPEAK, GENTRANDOM, TOBRATROUGH, TOBRAPEAK, TOBRARND, AMIKACINPEAK, AMIKACINTROU, AMIKACIN,  in the last 72 hours   Microbiology: Recent Results (from the past 720 hour(s))  MRSA PCR SCREENING     Status: None   Collection Time    08/03/13 10:36 PM      Result Value Ref Range Status   MRSA by PCR NEGATIVE  NEGATIVE Final   Comment:            The GeneXpert MRSA Assay (FDA     approved for NASAL specimens     only), is one component of a     comprehensive MRSA colonization     surveillance program. It is not     intended to diagnose MRSA     infection nor to guide or     monitor treatment for     MRSA infections.    Medical History: Past Medical History  Diagnosis Date  . Osteoporosis   . Hypothyroidism   . Hyperlipidemia   . Inflammatory arthropathy   . Hearing loss   . Gait abnormality     Medications:  Prescriptions prior to admission  Medication Sig Dispense Refill  . calcium-vitamin D (OSCAL WITH D) 500-200 MG-UNIT  per tablet Take 1 tablet by mouth daily.       Marland Kitchen. docusate calcium (SURFAK) 240 MG capsule Take 240 mg by mouth at bedtime.       Marland Kitchen. HYDROcodone-acetaminophen (NORCO/VICODIN) 5-325 MG per tablet Take 1 tablet by mouth every 6 (six) hours as needed for moderate pain.      Marland Kitchen. levothyroxine (SYNTHROID, LEVOTHROID) 25 MCG tablet Take 25 mcg by mouth daily before breakfast.      . mometasone (NASONEX) 50 MCG/ACT nasal spray Place 2 sprays into the nose daily.      . pantoprazole (PROTONIX) 40 MG tablet Take 40 mg by mouth daily before breakfast.       . polyethylene glycol (MIRALAX / GLYCOLAX) packet Take 17 g by mouth daily. Mix in 4-8 oz of liquid and drink      . sertraline (ZOLOFT) 25 MG tablet Take 25 mg by mouth daily.      . simvastatin (ZOCOR) 20 MG tablet Take 20 mg by mouth at bedtime.       . traMADol (ULTRAM) 50 MG tablet Take 50 mg by mouth every 4 (four) hours as needed (pain).        Assessment: 80 yo female with possible cellulitis for empiric antibiotics  Goal of Therapy:  Vancomycin trough level 10-15 mcg/ml  Plan:  Vancomycin 1 g IV now, then vancomycin 500 mg IV q24h Zosyn 3.375 g IV q8h   Morgan Christensen, Morgan Christensen [place] 08/04/2013,2:05 AM

## 2013-08-04 NOTE — Consult Note (Signed)
CARDIOLOGY CONSULT NOTE  Patient ID: Morgan Christensen MRN: [id number] DOB/AGE: 1914-10-27 80 y.o.  Admit date: 08/03/2013 [facility] Primary [facility]ician:  No primary provider on file. Reason for Consultation   Heart Block  HPI: patient is a fairly active 80 year old [place] female [place] close to her cousin, lives in the nursing home, initially I was called yesterday and told that patient slipped and fell and incidentally EKG had revealed AV block.  I saw her this morning, on further questioning, patient was in the bathroom, saw a large, wanted to pick up a box to cover the bulb, but then suddenly fell and does not recollect exactly what happened. She does not think that she begin stenting, does not think that she slipped. She developed a large bruise underneath her right eye, was evaluated in the emergency room and received a small stitch near the nares on the right.  Patient denies any headache, shortness of breath, chest pain, palpitations. Patient states that she is followed in the past, but does not recollect any episode of syncope.  There is no history of coronary artery disease, patient does have history of hyperlipidemia.there is no recent weight changes. There is no bowel or bladder disturbances. No symptoms to suggest stroke or TIA. No symptoms this is a claudication.  Past Medical History  Diagnosis Date  . Osteoporosis   . Hypothyroidism   . Hyperlipidemia   . Inflammatory arthropathy   . Hearing loss   . Gait abnormality      History reviewed. No pertinent past surgical history.   History reviewed. No pertinent family history.   Social History: History   Social History  . Marital Status: Widowed    Spouse Name: N/A    Number of Children: N/A  . Years of Education: N/A   Occupational History  . Not on file.   Social History Main Topics  . Smoking status: Never Smoker   . Smokeless tobacco: Not on  file  . Alcohol Use: No  . Drug Use: No  . Sexual Activity: Not Currently   Other Topics Concern  . Not on file   Social History Narrative  . No narrative on file     Prescriptions prior to admission  Medication Sig Dispense Refill  . calcium-vitamin D (OSCAL WITH D) 500-200 MG-UNIT per tablet Take 1 tablet by mouth daily.       Marland Kitchen. docusate calcium (SURFAK) 240 MG capsule Take 240 mg by mouth at bedtime.       Marland Kitchen. HYDROcodone-acetaminophen (NORCO/VICODIN) 5-325 MG per tablet Take 1 tablet by mouth every 6 (six) hours as needed for moderate pain.      Marland Kitchen. levothyroxine (SYNTHROID, LEVOTHROID) 25 MCG tablet Take 25 mcg by mouth daily before breakfast.      . mometasone (NASONEX) 50 MCG/ACT nasal spray Place 2 sprays into the nose daily.      . pantoprazole (PROTONIX) 40 MG tablet Take 40 mg by mouth daily before breakfast.       . polyethylene glycol (MIRALAX / GLYCOLAX) packet Take 17 g by mouth daily. Mix in 4-8 oz of liquid and drink      . sertraline (ZOLOFT) 25 MG tablet Take 25 mg by mouth daily.      . simvastatin (ZOCOR) 20 MG tablet Take 20 mg by mouth at bedtime.       . traMADol (ULTRAM) 50 MG tablet Take 50 mg by mouth every 4 (four)  hours as needed (pain).         Scheduled Meds: . calcium-vitamin D  1 tablet Oral Q breakfast  . docusate sodium  200 mg Oral QHS  . fluticasone  2 spray Each Nare Daily  . levothyroxine  25 mcg Oral QAC breakfast  . pantoprazole  40 mg Oral QAC breakfast  . polyethylene glycol  17 g Oral Daily  . sertraline  25 mg Oral Daily  . simvastatin  20 mg Oral QHS  . sodium chloride  3 mL Intravenous Q12H   Continuous Infusions: . sodium chloride 10 mL/hr at 08/04/13 0600   PRN Meds:.acetaminophen, acetaminophen, HYDROcodone-acetaminophen, ondansetron (ZOFRAN) IV, ondansetron, traMADol  ROS: General: no fevers/chills/night sweats Eyes: no blurry vision, diplopia, or amaurosis ENT:  Has hearing loss, has hearing aid Resp: no cough, wheezing, or  hemoptysis CV: no edema or palpitations GI: no abdominal pain, nausea, vomiting, diarrhea, or constipation GU: no dysuria, frequency, or hematuria Skin: no rash Other systems negative, please see history of present illness    Physical Exam: Blood pressure 156/65, pulse 61, temperature 98.3 F (36.8 C), temperature source Oral, resp. rate 15, height 5\' 3"  (1.6 m), weight 60 kg (132 lb 4.4 oz), SpO2 97.00%.   General appearance: alert, cooperative, appears stated age and no distress Neck: no carotid bruit, no JVD, supple, symmetrical, trachea midline and thyroid not enlarged, symmetric, no tenderness/mass/nodules bruising and mild hematoma underneath the right eye evident. Right nares with a stitch. Lungs: clear to auscultation bilaterally Heart: regular rate and rhythm, S1, S2 normal, no murmur, click, rub or gallop Abdomen: soft, non-tender; bowel sounds normal; no masses,  no organomegaly Extremities: extremities normal, atraumatic, no cyanosis or edema Pulses: carotid pulses normal, femoral pulse normal, pedal pulse faint bilaterally. Capillary refill normal Neurologic: Grossly normal  Labs:   Lab Results  Component Value Date   WBC 8.6 08/04/2013   HGB 11.7* 08/04/2013   HCT 38.5 08/04/2013   MCV 76.8* 08/04/2013   PLT 101* 08/04/2013    Recent Labs Lab 08/03/13 1920  08/04/13 0313  NA 140  < > 138  K 4.6  < > 4.0  CL 102  < > 100  CO2 24  --  22  BUN 14  < > 12  CREATININE 0.66  < > 0.61  CALCIUM 9.7  --  9.4  PROT 7.0  --   --   BILITOT <0.2*  --   --   ALKPHOS 59  --   --   ALT 8  --   --   AST 17  --   --   GLUCOSE 107*  < > 87  < > = values in this interval not displayed. Lab Results  Component Value Date   TROPONINI <0.30 08/03/2013    Lipid Panel  No results found for this basename: chol, trig, hdl, cholhdl, vldl, ldlcalc    EKG: 08/03/2013: Sinus rhythm with first degree AV block, left axis deviation, left anterior fascicular block, right bundle branch  block, trifascicular block. Nonspecific T. Abnormality.    Radiology:   CT scan of the head without contrast 08/03/2013: IMPRESSION: 1. No acute intracranial abnormality. 2. Moderate atrophy and diffuse white matter disease. 3. Posterior right ethmoid and left sphenoid sinus disease. 4. Extensive soft tissue swelling along the inferior aspect of the right orbit without an orbital injury. 5. Gas is present within the soft tissue swelling compatible with infection. 6. No focal osseous abnormality. 7. Posterior right ethmoid and  left sphenoid sinus disease.   Electronically Signed   By: Gennette Pachris  Mattern M.D.  CT maxillofacial 08/03/2013: MPRESSION: 1. No acute intracranial abnormality. 2. Moderate atrophy and diffuse white matter disease. 3. Posterior right ethmoid and left sphenoid sinus disease. 4. Extensive soft tissue swelling along the inferior aspect of the right orbit without an orbital injury. 5. Gas is present within the soft tissue swelling compatible with infection. 6. No focal osseous abnormality. 7. Posterior right ethmoid and left sphenoid sinus disease.   Electronically Signed   By: Gennette Pachris  Mattern M.D  ASSESSMENT AND PLAN:  1. High degree AV block, patient continues to have frequent episodes of Mobitz type II AV block. The episode of fall that happened yesterday clearly appears to be cardiac related and probably related to heart block. 2. Abnormal EKG, trifascicular block 3. Hyperlipidemia  Recommendation: Patient's symptoms appeared to more correlate with cardiogenic syncope. I will have EP evaluation performed for consideration of pacemaker implantation. If they feel it is appropriate, patient will proceed with the same. I have discussed with the patient and she is agreeable.  Pamella PertGANJI,JAGADEESH R, MD 08/04/2013, 10:33 AM [facility] Pager: [phone] Office: [phone] If no answer Cell [phone]

## 2013-08-04 NOTE — Progress Notes (Addendum)
[facility] PROGRESS NOTE Interim History: 80 y.o. female with history of hypothyroidism hyperlipidemia had a fall at her nursing home. Patient states that she saw some bug in the bathroom and was trying to avoid it when she slipped and fell. She hit her face and developed a right periorbital hematoma and also a laceration of the right nares. This was sutured in the ER. CT head and maxillofacial is pending. Patient on exam is nonfocal. Patient's EKG showed second degree AV block type II Mobitz.  Assessment/Plan:  *Mobitz type 2 second degree atrioventricular block/bradycardia: - currently stable awaiting Cardiology recommendations. - VS stable. She is not on any rate controlled medication.  Laceration of face with Periorbital trauma without ocular damage: - No sign of orbital or preseptal cellulitis - d/c antibiotics.  Hypothyroidism:  - High TSH. Check free T4  Hyperlipidemia  - on statins.   Anemia and thrombocytopenia: - closely follow CBC. Baseline counts not known.   Code Status: DO NOT RESUSCITATE.  Family Communication: None.  Disposition Plan: Admit to inpatient.     Consultants:  OPHTALMOLOGY  Procedures:  Ct head  Antibiotics:  Vanc and zosyn 6.15.2015  HPI/Subjective: No complains.  Objective: Filed Vitals:   08/04/13 0400 08/04/13 0500 08/04/13 0600 08/04/13 0742  BP: 142/73 141/55 156/65   Pulse: 63 49 61   Temp: 96.8 F (36 C)   98.3 F (36.8 C)  TempSrc: Axillary   Oral  Resp: 19 15 15    Height:      Weight:      SpO2: 95% 97% 97%     Intake/Output Summary (Last 24 hours) at 08/04/13 0803 Last data filed at 08/04/13 0600  Gross per 24 hour  Intake    690 ml  Output    700 ml  Net    -10 ml   Filed Weights   08/03/13 2200  Weight: 60 kg (132 lb 4.4 oz)    Exam:  General: Alert, awake, oriented x3, in no acute distress.  HEENT: left eye bruise no goiter.  Heart: Regular rate and rhythm, without murmurs, rubs, gallops.   Lungs: Good air movement, clear Abdomen: Soft, nontender, nondistended, positive bowel sounds.     Data Reviewed: Basic Metabolic Panel:  Recent Labs Lab 08/03/13 1920 08/03/13 1925 08/04/13 0313  NA 140 139 138  K 4.6 4.2 4.0  CL 102 101 100  CO2 24  --  22  GLUCOSE 107* 109* 87  BUN 14 13 12   CREATININE 0.66 0.70 0.61  CALCIUM 9.7  --  9.4   Liver Function Tests:  Recent Labs Lab 08/03/13 1920  AST 17  ALT 8  ALKPHOS 59  BILITOT <0.2*  PROT 7.0  ALBUMIN 4.0   No results found for this basename: LIPASE, AMYLASE,  in the last 168 hours No results found for this basename: AMMONIA,  in the last 168 hours CBC:  Recent Labs Lab 08/03/13 1920 08/03/13 1925 08/04/13 0313  WBC 8.4  --  8.6  NEUTROABS 6.2  --   --   HGB 11.4* 13.3 11.7*  HCT 38.1 39.0 38.5  MCV 77.3*  --  76.8*  PLT 107*  --  101*   Cardiac Enzymes:  Recent Labs Lab 08/03/13 1920  TROPONINI <0.30   BNP (last 3 results) No results found for this basename: PROBNP,  in the last 8760 hours CBG: No results found for this basename: GLUCAP,  in the last 168 hours  Recent Results (from the past  240 hour(s))  MRSA PCR SCREENING     Status: None   Collection Time    08/03/13 10:36 PM      Result Value Ref Range Status   MRSA by PCR NEGATIVE  NEGATIVE Final   Comment:            The GeneXpert MRSA Assay (FDA     approved for NASAL specimens     only), is one component of a     comprehensive MRSA colonization     surveillance program. It is not     intended to diagnose MRSA     infection nor to guide or     monitor treatment for     MRSA infections.     Studies: Ct Head Wo Contrast  08/03/2013   CLINICAL DATA:  Fall water trying to get up from the toilet. Injury to right side of the face.  EXAM: CT HEAD WITHOUT CONTRAST  CT MAXILLOFACIAL WITHOUT CONTRAST  TECHNIQUE: Multidetector CT imaging of the head and maxillofacial structures were performed using the standard protocol without  intravenous contrast. Multiplanar CT image reconstructions of the maxillofacial structures were also generated.  COMPARISON:  None.  FINDINGS: CT HEAD FINDINGS  The study is moderately degraded by patient motion. Moderate generalized atrophy is present. Periventricular white matter hypoattenuation is evident bilaterally. No acute cortical infarct, hemorrhage, or mass lesion. The ventricles are proportionate to the degree of atrophy.  Fluid is present in the left sphenoid sinus. A posterior right ethmoid air cell is opacified. The remaining paranasal sinuses and the mastoid air cells are clear. The osseous skull is intact.  CT MAXILLOFACIAL FINDINGS  Extensive soft tissue swelling is noted along the right intraorbital soft tissue adjacent to the right maxillary sinus and right nasal bone without an underlying fracture. The nasal bones are intact. The nasal cavity is clear. The ostiomeatal complex is patent bilaterally with a with small ethmoid bulla on the left.  The patient is status post bilateral lens extractions. The globes and orbits are otherwise intact.  The posterior right ethmoid air cell is near completely opacified. Fluid is present in the left sphenoid sinus.  IMPRESSION: 1. No acute intracranial abnormality. 2. Moderate atrophy and diffuse white matter disease. 3. Posterior right ethmoid and left sphenoid sinus disease. 4. Extensive soft tissue swelling along the inferior aspect of the right orbit without an orbital injury. 5. Gas is present within the soft tissue swelling compatible with infection. 6. No focal osseous abnormality. 7. Posterior right ethmoid and left sphenoid sinus disease.   Electronically Signed   By: Gennette Pachris  Mattern M.D.   On: 08/03/2013 21:53   Ct Maxillofacial Wo Cm  08/03/2013   CLINICAL DATA:  Fall water trying to get up from the toilet. Injury to right side of the face.  EXAM: CT HEAD WITHOUT CONTRAST  CT MAXILLOFACIAL WITHOUT CONTRAST  TECHNIQUE: Multidetector CT imaging of  the head and maxillofacial structures were performed using the standard protocol without intravenous contrast. Multiplanar CT image reconstructions of the maxillofacial structures were also generated.  COMPARISON:  None.  FINDINGS: CT HEAD FINDINGS  The study is moderately degraded by patient motion. Moderate generalized atrophy is present. Periventricular white matter hypoattenuation is evident bilaterally. No acute cortical infarct, hemorrhage, or mass lesion. The ventricles are proportionate to the degree of atrophy.  Fluid is present in the left sphenoid sinus. A posterior right ethmoid air cell is opacified. The remaining paranasal sinuses and the mastoid air cells are clear.  The osseous skull is intact.  CT MAXILLOFACIAL FINDINGS  Extensive soft tissue swelling is noted along the right intraorbital soft tissue adjacent to the right maxillary sinus and right nasal bone without an underlying fracture. The nasal bones are intact. The nasal cavity is clear. The ostiomeatal complex is patent bilaterally with a with small ethmoid bulla on the left.  The patient is status post bilateral lens extractions. The globes and orbits are otherwise intact.  The posterior right ethmoid air cell is near completely opacified. Fluid is present in the left sphenoid sinus.  IMPRESSION: 1. No acute intracranial abnormality. 2. Moderate atrophy and diffuse white matter disease. 3. Posterior right ethmoid and left sphenoid sinus disease. 4. Extensive soft tissue swelling along the inferior aspect of the right orbit without an orbital injury. 5. Gas is present within the soft tissue swelling compatible with infection. 6. No focal osseous abnormality. 7. Posterior right ethmoid and left sphenoid sinus disease.   Electronically Signed   By: Gennette Pac M.D.   On: 08/03/2013 21:53    Scheduled Meds: . calcium-vitamin D  1 tablet Oral Q breakfast  . docusate sodium  200 mg Oral QHS  . fluticasone  2 spray Each Nare Daily  .  levothyroxine  25 mcg Oral QAC breakfast  . pantoprazole  40 mg Oral QAC breakfast  . piperacillin-tazobactam (ZOSYN)  IV  3.375 g Intravenous 3 times per day  . polyethylene glycol  17 g Oral Daily  . sertraline  25 mg Oral Daily  . simvastatin  20 mg Oral QHS  . sodium chloride  3 mL Intravenous Q12H  . [START ON 08/05/2013] vancomycin  500 mg Intravenous Q24H   Continuous Infusions: . sodium chloride 10 mL/hr at 08/04/13 0600     FELIZ Rosine Beat  [facility] Pager [phone]. If 8PM-8AM, please contact night-coverage at [email or web address], password [facility] 08/04/2013, 8:03 AM  LOS: 1 day    **Disclaimer: This note may have been dictated with voice recognition software. Similar sounding words can inadvertently be transcribed and this note may contain transcription errors which may not have been corrected upon publication of note.**

## 2013-08-04 NOTE — ED Provider Notes (Signed)
Medical screening examination/treatment/procedure(s) were conducted as a shared visit with non-physician practitioner(s) or resident and myself. I personally evaluated the patient during the encounter and agree with the findings and plan unless otherwise indicated.  I have personally reviewed any xrays and/ or EKG's with the provider and I agree with interpretation.  [facility] after she had a fall leading to a fall in her face with small laceration. Patient recalls most the details and denies significant lightheadedness or syncope, denies temperature is breath. Patient's heart rate was in the 30s hernia mass and intermittently in the 30s at bedside. Patient reported he had a heart rate in the past however has no pacemaker and is usually asymptomatic. Patient's on Plavix however no beta blocker or calcium channel blockers. No new medicines. Mild pain at the site of the wound. Patient denies other symptoms at this time. On exam bradycardia, small laceration right midface with bleeding controlled, neck supple, lungs clear, no distress. Plan for cardiology consult and likely telemetry admission observation. Laceration will be repaired by the resident.  Rhythm strip reviewed and printed heart rate 37 either complete heart block versus 2-1 block, right bundle branch block visualized, and no acute ST elevation.  Bradycardia, type II 2nd degree block, Fall, Facial laceration, acute head injury   Enid SkeensJoshua M Cady Hafen, MD 08/04/13 [phone]

## 2013-08-04 NOTE — Consult Note (Signed)
Reason for consult: "rule out orbital infection" Per hospitalist  HPI: Morgan Christensen is an 80 y.o. female.  She fell 2 days ago.  She has extensive bruising around right eye.  CT showed no fracture, but radiologist read "air in soft tissue compatible with infection".  Thus, ophthalmology was called.   Patient reports the bruise around the eye was more swollen yesterday.  "Much better today."  No change in vision.  No current pain.  No floaters or flashes.  Past ocular history:  Cataract surgery OU  Past Medical History  Diagnosis Date  . Osteoporosis   . Hypothyroidism   . Hyperlipidemia   . Inflammatory arthropathy   . Hearing loss   . Gait abnormality    History reviewed. No pertinent past surgical history. History reviewed. No pertinent family history. Current Facility-Administered Medications  Medication Dose Route Frequency Provider Last Rate Last Dose  . 0.9 %  sodium chloride infusion   Intravenous Continuous Eduard Clos, MD 10 mL/hr at 08/04/13 0600    . acetaminophen (TYLENOL) tablet 650 mg  650 mg Oral Q6H PRN Eduard Clos, MD       Or  . acetaminophen (TYLENOL) suppository 650 mg  650 mg Rectal Q6H PRN Eduard Clos, MD      . calcium-vitamin D (OSCAL WITH D) 500-200 MG-UNIT per tablet 1 tablet  1 tablet Oral Q breakfast Eduard Clos, MD      . docusate sodium (COLACE) capsule 200 mg  200 mg Oral QHS Eduard Clos, MD      . fluticasone (FLONASE) 50 MCG/ACT nasal spray 2 spray  2 spray Each Nare Daily Eduard Clos, MD      . HYDROcodone-acetaminophen (NORCO/VICODIN) 5-325 MG per tablet 1 tablet  1 tablet Oral Q6H PRN Eduard Clos, MD      . levothyroxine (SYNTHROID, LEVOTHROID) tablet 25 mcg  25 mcg Oral QAC breakfast Eduard Clos, MD      . ondansetron [facility]) tablet 4 mg  4 mg Oral Q6H PRN Eduard Clos, MD       Or  . ondansetron [facility]) injection 4 mg  4 mg Intravenous Q6H PRN Eduard Clos, MD      .  pantoprazole (PROTONIX) EC tablet 40 mg  40 mg Oral QAC breakfast Eduard Clos, MD      . piperacillin-tazobactam (ZOSYN) IVPB 3.375 g  3.375 g Intravenous 3 times per day Eduard Clos, MD   3.375 g at 08/04/13 0226  . polyethylene glycol (MIRALAX / GLYCOLAX) packet 17 g  17 g Oral Daily Eduard Clos, MD      . sertraline (ZOLOFT) tablet 25 mg  25 mg Oral Daily Eduard Clos, MD      . simvastatin (ZOCOR) tablet 20 mg  20 mg Oral QHS Eduard Clos, MD   20 mg at 08/03/13 2302  . sodium chloride 0.9 % injection 3 mL  3 mL Intravenous Q12H Eduard Clos, MD      . traMADol Janean Sark) tablet 50 mg  50 mg Oral Q4H PRN Eduard Clos, MD      . Melene Muller ON 08/05/2013] vancomycin (VANCOCIN) 500 mg in sodium chloride 0.9 % 100 mL IVPB  500 mg Intravenous Q24H Eduard Clos, MD       Allergies  Allergen Reactions  . Aspirin Nausea And Vomiting  . Shellfish-Derived Products Nausea And Vomiting   History   Social History  .  Marital Status: Widowed    Spouse Name: N/A    Number of Children: N/A  . Years of Education: N/A   Occupational History  . Not on file.   Social History Main Topics  . Smoking status: Never Smoker   . Smokeless tobacco: Not on file  . Alcohol Use: No  . Drug Use: No  . Sexual Activity: Not Currently   Other Topics Concern  . Not on file   Social History Narrative  . No narrative on file    Review of systems: Review of Systems  Constitutional: Negative for fever and chills.  HENT: Positive for hearing loss.   Cardiovascular: Negative for chest pain.  Gastrointestinal: Negative for nausea and vomiting.  Skin: Negative for rash.    Physical Exam:  Blood pressure 156/65, pulse 61, temperature 98.3 F (36.8 C), temperature source Oral, resp. rate 15, height $RemoveBe'5\' 3"'[id number]$  (1.6 m), weight 60 kg (132 lb 4.4 oz), SpO2 97.00%.   VA cc (OTC rdrs):  OD 20/30  OS  20/60  Pupils:   OD round, reactive to light, no APD             OS round, reactive to light, no APD  IOP (T pen)  OD 22   OS  22  CVF: OD full to CF   OS full to CF  Motility:  OD full ductions  OS full ductions  Balance/alignment:  Ortho by Luiz Ochoa   Slit lamp examination:                                 OD                                       External/adnexa: extensive periocular skin ecchymosis                                    Lids/lashes:        Ecchymosis without edema                                     Conjunctiva        White, quiet, no subconjunctival hemorrhage       Cornea:              Clear                  AC:                     Deep, quiet                                Iris:                     Normal        Lens:                  IOL  OS                                       External/adnexa: Normal                                      Lids/lashes:        Normal                                      Conjunctiva        White, quiet        Cornea:              Clear                  AC:                     Deep, quiet                                Iris:                     Normal        Lens:                  IOL         Labs/studies: Results for orders placed during the hospital encounter of 08/03/13 (from the past 48 hour(s))  COMPREHENSIVE METABOLIC PANEL     Status: Abnormal   Collection Time    08/03/13  7:20 PM      Result Value Ref Range   Sodium 140  137 - 147 mEq/L   Potassium 4.6  3.7 - 5.3 mEq/L   Chloride 102  96 - 112 mEq/L   CO2 24  19 - 32 mEq/L   Glucose, Bld 107 (*) 70 - 99 mg/dL   BUN 14  6 - 23 mg/dL   Creatinine, Ser 0.66  0.50 - 1.10 mg/dL   Calcium 9.7  8.4 - 10.5 mg/dL   Total Protein 7.0  6.0 - 8.3 g/dL   Albumin 4.0  3.5 - 5.2 g/dL   AST 17  0 - 37 U/L   ALT 8  0 - 35 U/L   Alkaline Phosphatase 59  39 - 117 U/L   Total Bilirubin <0.2 (*) 0.3 - 1.2 mg/dL   GFR calc non Af Amer 72 (*) >90 mL/min   GFR calc Af Amer 84 (*) >90 mL/min   Comment: (NOTE)      The eGFR has been calculated using the CKD EPI equation.     This calculation has not been validated in all clinical situations.     eGFR's persistently <90 mL/min signify possible Chronic Kidney     Disease.  TROPONIN I     Status: None   Collection Time    08/03/13  7:20 PM      Result Value Ref Range   Troponin I <0.30  <0.30 ng/mL   Comment:            Due to the release kinetics of cTnI,     a negative result within the first hours  of the onset of symptoms does not rule out     myocardial infarction with certainty.     If myocardial infarction is still suspected,     repeat the test at appropriate intervals.  CBC WITH DIFFERENTIAL     Status: Abnormal   Collection Time    08/03/13  7:20 PM      Result Value Ref Range   WBC 8.4  4.0 - 10.5 K/uL   RBC 4.93  3.87 - 5.11 MIL/uL   Hemoglobin 11.4 (*) 12.0 - 15.0 g/dL   HCT 93.8  53.7 - 08.3 %   MCV 77.3 (*) 78.0 - 100.0 fL   MCH 23.1 (*) 26.0 - 34.0 pg   MCHC 29.9 (*) 30.0 - 36.0 g/dL   RDW 32.7  38.0 - 94.7 %   Platelets 107 (*) 150 - 400 K/uL   Comment: PLATELET COUNT CONFIRMED BY SMEAR   Neutrophils Relative % 73  43 - 77 %   Neutro Abs 6.2  1.7 - 7.7 K/uL   Lymphocytes Relative 19  12 - 46 %   Lymphs Abs 1.6  0.7 - 4.0 K/uL   Monocytes Relative 6  3 - 12 %   Monocytes Absolute 0.5  0.1 - 1.0 K/uL   Eosinophils Relative 1  0 - 5 %   Eosinophils Absolute 0.1  0.0 - 0.7 K/uL   Basophils Relative 0  0 - 1 %   Basophils Absolute 0.0  0.0 - 0.1 K/uL  I-STAT TROPOININ, ED     Status: None   Collection Time    08/03/13  7:24 PM      Result Value Ref Range   Troponin i, poc 0.01  0.00 - 0.08 ng/mL   Comment 3            Comment: Due to the release kinetics of cTnI,     a negative result within the first hours     of the onset of symptoms does not rule out     myocardial infarction with certainty.     If myocardial infarction is still suspected,     repeat the test at appropriate intervals.  I-STAT CHEM 8, ED      Status: Abnormal   Collection Time    08/03/13  7:25 PM      Result Value Ref Range   Sodium 139  137 - 147 mEq/L   Potassium 4.2  3.7 - 5.3 mEq/L   Chloride 101  96 - 112 mEq/L   BUN 13  6 - 23 mg/dL   Creatinine, Ser 0.11  0.50 - 1.10 mg/dL   Glucose, Bld 043 (*) 70 - 99 mg/dL   Calcium, Ion 8.13  4.97 - 1.30 mmol/L   TCO2 24  0 - 100 mmol/L   Hemoglobin 13.3  12.0 - 15.0 g/dL   HCT 53.0  45.9 - 66.2 %  URINALYSIS, ROUTINE W REFLEX MICROSCOPIC     Status: Abnormal   Collection Time    08/03/13  8:44 PM      Result Value Ref Range   Color, Urine YELLOW  YELLOW   APPearance CLEAR  CLEAR   Specific Gravity, Urine 1.012  1.005 - 1.030   pH 5.5  5.0 - 8.0   Glucose, UA NEGATIVE  NEGATIVE mg/dL   Hgb urine dipstick NEGATIVE  NEGATIVE   Bilirubin Urine NEGATIVE  NEGATIVE   Ketones, ur NEGATIVE  NEGATIVE mg/dL   Protein, ur NEGATIVE  NEGATIVE mg/dL  Urobilinogen, UA 0.2  0.0 - 1.0 mg/dL   Nitrite NEGATIVE  NEGATIVE   Leukocytes, UA TRACE (*) NEGATIVE  URINE MICROSCOPIC-ADD ON     Status: None   Collection Time    08/03/13  8:44 PM      Result Value Ref Range   Squamous Epithelial / LPF RARE  RARE   WBC, UA 3-6  <3 WBC/hpf   Bacteria, UA RARE  RARE  MRSA PCR SCREENING     Status: None   Collection Time    08/03/13 10:36 PM      Result Value Ref Range   MRSA by PCR NEGATIVE  NEGATIVE   Comment:            The GeneXpert MRSA Assay (FDA     approved for NASAL specimens     only), is one component of a     comprehensive MRSA colonization     surveillance program. It is not     intended to diagnose MRSA     infection nor to guide or     monitor treatment for     MRSA infections.  BASIC METABOLIC PANEL     Status: Abnormal   Collection Time    08/04/13  3:13 AM      Result Value Ref Range   Sodium 138  137 - 147 mEq/L   Potassium 4.0  3.7 - 5.3 mEq/L   Chloride 100  96 - 112 mEq/L   CO2 22  19 - 32 mEq/L   Glucose, Bld 87  70 - 99 mg/dL   BUN 12  6 - 23 mg/dL    Creatinine, Ser 0.61  0.50 - 1.10 mg/dL   Calcium 9.4  8.4 - 10.5 mg/dL   GFR calc non Af Amer 74 (*) >90 mL/min   GFR calc Af Amer 86 (*) >90 mL/min   Comment: (NOTE)     The eGFR has been calculated using the CKD EPI equation.     This calculation has not been validated in all clinical situations.     eGFR's persistently <90 mL/min signify possible Chronic Kidney     Disease.  CBC     Status: Abnormal   Collection Time    08/04/13  3:13 AM      Result Value Ref Range   WBC 8.6  4.0 - 10.5 K/uL   RBC 5.01  3.87 - 5.11 MIL/uL   Hemoglobin 11.7 (*) 12.0 - 15.0 g/dL   HCT 38.5  36.0 - 46.0 %   MCV 76.8 (*) 78.0 - 100.0 fL   MCH 23.4 (*) 26.0 - 34.0 pg   MCHC 30.4  30.0 - 36.0 g/dL   RDW 14.7  11.5 - 15.5 %   Platelets 101 (*) 150 - 400 K/uL   Comment: CONSISTENT WITH PREVIOUS RESULT     REPEATED TO VERIFY  TSH     Status: Abnormal   Collection Time    08/04/13  3:13 AM      Result Value Ref Range   TSH 14.270 (*) 0.350 - 4.500 uIU/mL   Ct Head Wo Contrast  08/03/2013   CLINICAL DATA:  Fall water trying to get up from the toilet. Injury to right side of the face.  EXAM: CT HEAD WITHOUT CONTRAST  CT MAXILLOFACIAL WITHOUT CONTRAST  TECHNIQUE: Multidetector CT imaging of the head and maxillofacial structures were performed using the standard protocol without intravenous contrast. Multiplanar CT image reconstructions of the maxillofacial structures were also  generated.  COMPARISON:  None.  FINDINGS: CT HEAD FINDINGS  The study is moderately degraded by patient motion. Moderate generalized atrophy is present. Periventricular white matter hypoattenuation is evident bilaterally. No acute cortical infarct, hemorrhage, or mass lesion. The ventricles are proportionate to the degree of atrophy.  Fluid is present in the left sphenoid sinus. A posterior right ethmoid air cell is opacified. The remaining paranasal sinuses and the mastoid air cells are clear. The osseous skull is intact.  CT  MAXILLOFACIAL FINDINGS  Extensive soft tissue swelling is noted along the right intraorbital soft tissue adjacent to the right maxillary sinus and right nasal bone without an underlying fracture. The nasal bones are intact. The nasal cavity is clear. The ostiomeatal complex is patent bilaterally with a with small ethmoid bulla on the left.  The patient is status post bilateral lens extractions. The globes and orbits are otherwise intact.  The posterior right ethmoid air cell is near completely opacified. Fluid is present in the left sphenoid sinus.  IMPRESSION: 1. No acute intracranial abnormality. 2. Moderate atrophy and diffuse white matter disease. 3. Posterior right ethmoid and left sphenoid sinus disease. 4. Extensive soft tissue swelling along the inferior aspect of the right orbit without an orbital injury. 5. Gas is present within the soft tissue swelling compatible with infection. 6. No focal osseous abnormality. 7. Posterior right ethmoid and left sphenoid sinus disease.   Electronically Signed   By: Lawrence Santiago M.D.   On: 08/03/2013 21:53   Ct Maxillofacial Wo Cm  08/03/2013   CLINICAL DATA:  Fall water trying to get up from the toilet. Injury to right side of the face.  EXAM: CT HEAD WITHOUT CONTRAST  CT MAXILLOFACIAL WITHOUT CONTRAST  TECHNIQUE: Multidetector CT imaging of the head and maxillofacial structures were performed using the standard protocol without intravenous contrast. Multiplanar CT image reconstructions of the maxillofacial structures were also generated.  COMPARISON:  None.  FINDINGS: CT HEAD FINDINGS  The study is moderately degraded by patient motion. Moderate generalized atrophy is present. Periventricular white matter hypoattenuation is evident bilaterally. No acute cortical infarct, hemorrhage, or mass lesion. The ventricles are proportionate to the degree of atrophy.  Fluid is present in the left sphenoid sinus. A posterior right ethmoid air cell is opacified. The remaining  paranasal sinuses and the mastoid air cells are clear. The osseous skull is intact.  CT MAXILLOFACIAL FINDINGS  Extensive soft tissue swelling is noted along the right intraorbital soft tissue adjacent to the right maxillary sinus and right nasal bone without an underlying fracture. The nasal bones are intact. The nasal cavity is clear. The ostiomeatal complex is patent bilaterally with a with small ethmoid bulla on the left.  The patient is status post bilateral lens extractions. The globes and orbits are otherwise intact.  The posterior right ethmoid air cell is near completely opacified. Fluid is present in the left sphenoid sinus.  IMPRESSION: 1. No acute intracranial abnormality. 2. Moderate atrophy and diffuse white matter disease. 3. Posterior right ethmoid and left sphenoid sinus disease. 4. Extensive soft tissue swelling along the inferior aspect of the right orbit without an orbital injury. 5. Gas is present within the soft tissue swelling compatible with infection. 6. No focal osseous abnormality. 7. Posterior right ethmoid and left sphenoid sinus disease.   Electronically Signed   By: Lawrence Santiago M.D.   On: 08/03/2013 21:53  Assessment and Plan: 1.  Periorbital trauma without ocular damage.  No sign of orbital or preseptal cellulitis.    2. Sinus disease on CT.  That is an issue for the primary team to address   All of the above information was relayed to the patient and/or patient family.  Call if vision change or eye pain develops.    Follow up contact information was provided.  All questions were answered.   [place] L 08/04/2013, 7:58 AM  [facility] [phone]

## 2013-08-04 NOTE — Consult Note (Signed)
ELECTROPHYSIOLOGY CONSULT NOTE   Patient ID: Morgan Christensen MRN: [id number], DOB/AGE: April 25, 1914   Admit date: 08/03/2013 Date of Consult: 08/04/2013  Primary Physician: No primary provider on file. Primary Cardiologist: Joanne Gavel, MD - [facility] in [place], [place] Reason for Consultation: Mobitz II AV block  History of Present Illness Morgan Christensen is a 80 y.o. female with dyslipidemia and hypothyroidism who present yesterday after a fall. She states she was in the bathroom at the nursing facility where she resides and she saw a large bug crawling on the floor. She has seen this bug 3 times before in the bathroom and this time she was determined to kill it. She reached over to grab the trash can to put down over the bug; however, she tells she "nearly killed myself trying to kill that bug." She is adamant that she remembers all that happened. She did not lose consciousness. She remembers hitting her face on the metal rod attached to the wall beside the commode as she fell. She was sitting on the floor and was able to pull her cord for help. She denies CP or SOB. She denies palpitations. She denies dizziness or lightheadedness. She denies syncope. Of note, she tells me she was followed by a cardiologist in [place], [place] and has been told her heart rate is slow. She states "They've talked to me about a pacemaker before but then told me I didn't need one."      Past Medical History Past Medical History  Diagnosis Date  . Osteoporosis   . Hypothyroidism   . Hyperlipidemia   . Inflammatory arthropathy   . Hearing loss   . Gait abnormality     Past Surgical History Appendectomy Tonsillectomy   Allergies/Intolerances Allergies  Allergen Reactions  . Aspirin Nausea And Vomiting  . Shellfish-Derived Products Nausea And Vomiting   Current Home Medications      calcium-vitamin D 500-200 MG-UNIT per tablet  Commonly known as:  OSCAL WITH D  Take 1 tablet by mouth daily.     docusate  calcium 240 MG capsule  Commonly known as:  SURFAK  Take 240 mg by mouth at bedtime.     HYDROcodone-acetaminophen 5-325 MG per tablet  Commonly known as:  NORCO/VICODIN  Take 1 tablet by mouth every 6 (six) hours as needed for moderate pain.     levothyroxine 25 MCG tablet  Commonly known as:  SYNTHROID, LEVOTHROID  Take 25 mcg by mouth daily before breakfast.     mometasone 50 MCG/ACT nasal spray  Commonly known as:  NASONEX  Place 2 sprays into the nose daily.     pantoprazole 40 MG tablet  Commonly known as:  PROTONIX  Take 40 mg by mouth daily before breakfast.     polyethylene glycol packet  Commonly known as:  MIRALAX / GLYCOLAX  Take 17 g by mouth daily. Mix in 4-8 oz of liquid and drink     sertraline 25 MG tablet  Commonly known as:  ZOLOFT  Take 25 mg by mouth daily.     traMADol 50 MG tablet  Commonly known as:  ULTRAM  Take 50 mg by mouth every 4 (four) hours as needed (pain).     ZOCOR 20 MG tablet  Generic drug:  simvastatin  Take 20 mg by mouth at bedtime.      Inpatient Medications . calcium-vitamin D  1 tablet Oral Q breakfast  . docusate sodium  200 mg Oral QHS  . fluticasone  2 spray Each  Nare Daily  . levothyroxine  25 mcg Oral QAC breakfast  . pantoprazole  40 mg Oral QAC breakfast  . polyethylene glycol  17 g Oral Daily  . sertraline  25 mg Oral Daily  . simvastatin  20 mg Oral QHS  . sodium chloride  3 mL Intravenous Q12H   . sodium chloride 10 mL/hr at 08/04/13 0600    Family History Negative for CAD   Social History Social History  . Marital Status: Widowed; moved to [place] from [place] to be close to family   Social History Main Topics  . Smoking status: Never Smoker   . Smokeless tobacco: No  . Alcohol Use: No  . Drug Use: No   Review of Systems General: No chills, fever, night sweats or weight changes  Cardiovascular:  No chest pain, dyspnea on exertion, edema, orthopnea, palpitations, paroxysmal nocturnal  dyspnea Dermatological: No rash, lesions or masses Respiratory: No cough, dyspnea Urologic: No hematuria, dysuria Abdominal: No nausea, vomiting, diarrhea, bright red blood per rectum, melena, or hematemesis Neurologic: No visual changes, weakness, changes in mental status All other systems reviewed and are otherwise negative except as noted above.  Physical Exam Vitals: Blood pressure 156/65, pulse 61, temperature 98.3 F (36.8 C), temperature source Oral, resp. rate 15, height 5\' 3"  (1.6 m), weight 132 lb 4.4 oz (60 kg), SpO2 97.00%.  General: Well developed, elderly 80 y.o. female in no acute distress. HEENT: Normocephalic. Periorbital and righ cheek ecchymoses. EOMs intact. Sclera nonicteric. Oropharynx clear.  Neck: Supple. No JVD. Lungs: Respirations regular and unlabored, CTA bilaterally. No wheezes, rales or rhonchi. Heart: Bradycardic. S1, S2 present. No murmurs, rub, S3 or S4. Abdomen: Soft, non-distended.  Extremities: No clubbing, cyanosis or edema. DP/PT/Radials 2+ and equal bilaterally. Psych: Normal affect. Neuro: Alert and oriented X 3. Moves all extremities spontaneously. Musculoskeletal: No kyphosis. Skin: Intact. Warm and dry. No rashes in exposed areas.   Labs  Recent Labs  08/03/13 1920  TROPONINI <0.30   Lab Results  Component Value Date   WBC 8.6 08/04/2013   HGB 11.7* 08/04/2013   HCT 38.5 08/04/2013   MCV 76.8* 08/04/2013   PLT 101* 08/04/2013    Recent Labs Lab 08/03/13 1920  08/04/13 0313  NA 140  < > 138  K 4.6  < > 4.0  CL 102  < > 100  CO2 24  --  22  BUN 14  < > 12  CREATININE 0.66  < > 0.61  CALCIUM 9.7  --  9.4  PROT 7.0  --   --   BILITOT <0.2*  --   --   ALKPHOS 59  --   --   ALT 8  --   --   AST 17  --   --   GLUCOSE 107*  < > 87  < > = values in this interval not displayed.   Recent Labs  08/04/13 0313  TSH 14.270*  Free T4 pending  Radiology/Studies Ct Head Wo Contrast  08/03/2013   CLINICAL DATA:  Fall water trying to  get up from the toilet. Injury to right side of the face.  EXAM: CT HEAD WITHOUT CONTRAST  CT MAXILLOFACIAL WITHOUT CONTRAST  TECHNIQUE: Multidetector CT imaging of the head and maxillofacial structures were performed using the standard protocol without intravenous contrast. Multiplanar CT image reconstructions of the maxillofacial structures were also generated.  COMPARISON:  None.  FINDINGS: CT HEAD FINDINGS  The study is moderately degraded by patient motion. Moderate generalized  atrophy is present. Periventricular white matter hypoattenuation is evident bilaterally. No acute cortical infarct, hemorrhage, or mass lesion. The ventricles are proportionate to the degree of atrophy.  Fluid is present in the left sphenoid sinus. A posterior right ethmoid air cell is opacified. The remaining paranasal sinuses and the mastoid air cells are clear. The osseous skull is intact.  CT MAXILLOFACIAL FINDINGS  Extensive soft tissue swelling is noted along the right intraorbital soft tissue adjacent to the right maxillary sinus and right nasal bone without an underlying fracture. The nasal bones are intact. The nasal cavity is clear. The ostiomeatal complex is patent bilaterally with a with small ethmoid bulla on the left.  The patient is status post bilateral lens extractions. The globes and orbits are otherwise intact.  The posterior right ethmoid air cell is near completely opacified. Fluid is present in the left sphenoid sinus.  IMPRESSION: 1. No acute intracranial abnormality. 2. Moderate atrophy and diffuse white matter disease. 3. Posterior right ethmoid and left sphenoid sinus disease. 4. Extensive soft tissue swelling along the inferior aspect of the right orbit without an orbital injury. 5. Gas is present within the soft tissue swelling compatible with infection. 6. No focal osseous abnormality. 7. Posterior right ethmoid and left sphenoid sinus disease.   Electronically Signed   By: Gennette Pac M.D.   On:  08/03/2013 21:53   Ct Maxillofacial Wo Cm  08/03/2013   CLINICAL DATA:  Fall water trying to get up from the toilet. Injury to right side of the face.  EXAM: CT HEAD WITHOUT CONTRAST  CT MAXILLOFACIAL WITHOUT CONTRAST  TECHNIQUE: Multidetector CT imaging of the head and maxillofacial structures were performed using the standard protocol without intravenous contrast. Multiplanar CT image reconstructions of the maxillofacial structures were also generated.  COMPARISON:  None.  FINDINGS: CT HEAD FINDINGS  The study is moderately degraded by patient motion. Moderate generalized atrophy is present. Periventricular white matter hypoattenuation is evident bilaterally. No acute cortical infarct, hemorrhage, or mass lesion. The ventricles are proportionate to the degree of atrophy.  Fluid is present in the left sphenoid sinus. A posterior right ethmoid air cell is opacified. The remaining paranasal sinuses and the mastoid air cells are clear. The osseous skull is intact.  CT MAXILLOFACIAL FINDINGS  Extensive soft tissue swelling is noted along the right intraorbital soft tissue adjacent to the right maxillary sinus and right nasal bone without an underlying fracture. The nasal bones are intact. The nasal cavity is clear. The ostiomeatal complex is patent bilaterally with a with small ethmoid bulla on the left.  The patient is status post bilateral lens extractions. The globes and orbits are otherwise intact.  The posterior right ethmoid air cell is near completely opacified. Fluid is present in the left sphenoid sinus.  IMPRESSION: 1. No acute intracranial abnormality. 2. Moderate atrophy and diffuse white matter disease. 3. Posterior right ethmoid and left sphenoid sinus disease. 4. Extensive soft tissue swelling along the inferior aspect of the right orbit without an orbital injury. 5. Gas is present within the soft tissue swelling compatible with infection. 6. No focal osseous abnormality. 7. Posterior right ethmoid  and left sphenoid sinus disease.   Electronically Signed   By: Gennette Pac M.D.   On: 08/03/2013 21:53    Echocardiogram  pending  12-lead ECG yesterday - sinus bradycardia with RBBB at 55 bpm Telemetry reviewed - sinus bradycardia in low 40s with intermittent Mobitz II AV block  Assessment and Plan Syncope Mobitz II  AV block Sinus bradycardia Hypothyroidism    Signed, Rick DuffDMISTEN, BROOKE, PA-C 08/04/2013, 11:21 AM   EP Attending  Patient seen and examined. The patient has a h/o unexplained fall, conduction system disease with RBBB and Mobitz 2, second degree AV block. I have recommended proceeding with PPM with the patient and she is considering her options. I have reviewed the risks/benefits/goals/expectations of the procedure with the patient. She wants to speak to her niece who us POA for healthcare.  Leonia ReevesGregg Taylor,M.D.

## 2013-08-04 NOTE — Evaluation (Signed)
Physical Therapy Evaluation Patient Details Name: Morgan Christensen MRN: [id number] DOB: 08/27/1914 Today's Date: 08/04/2013   History of Present Illness  Pt adm after fall in shower when trying to avoid a bug. Pt found to have Mobitz II heart block. Cardiology feels fall was cardiogenic syncope and is being evaluated for a pacemaker.  Clinical Impression  Pt admitted with above. Pt currently with functional limitations due to the deficits listed below (see PT Problem List).  Pt will benefit from skilled PT to increase their independence and safety with mobility to allow discharge to the venue listed below.       Follow Up Recommendations SNF    Equipment Recommendations  None recommended by PT    Recommendations for Other Services       Precautions / Restrictions Precautions Precautions: Fall      Mobility  Bed Mobility Overal bed mobility: Needs Assistance Bed Mobility: Supine to Sit     Supine to sit: Supervision;HOB elevated     General bed mobility comments: supervision for lines/tubes  Transfers Overall transfer level: Needs assistance Equipment used: Rolling walker (2 wheeled) Transfers: Sit to/from Stand Sit to Stand: Min guard         General transfer comment: Verbal cues for hand placement.  Ambulation/Gait Ambulation/Gait assistance: Min guard Ambulation Distance (Feet): 40 Feet Assistive device: Rolling walker (2 wheeled) Gait Pattern/deviations: Step-through pattern;Decreased step length - right;Decreased step length - left;Trunk flexed     General Gait Details: Verbal cues for technique  Stairs            Wheelchair Mobility    Modified Rankin (Stroke Patients Only)       Balance Overall balance assessment: Needs assistance Sitting-balance support: No upper extremity supported Sitting balance-Leahy Scale: Good     Standing balance support: Bilateral upper extremity supported Standing balance-Leahy Scale: Poor                                Pertinent Vitals/Pain HR 40-50's    Home Living Family/patient expects to be discharged to:: Skilled nursing facility                      Prior Function           Comments: Amb independently with rollator.     Hand Dominance        Extremity/Trunk Assessment   Upper Extremity Assessment: Overall WFL for tasks assessed           Lower Extremity Assessment: Overall WFL for tasks assessed         Communication      Cognition Arousal/Alertness: Awake/alert Behavior During Therapy: WFL for tasks assessed/performed Overall Cognitive Status: Within Functional Limits for tasks assessed                      General Comments      Exercises        Assessment/Plan    PT Assessment Patient needs continued PT services  PT Diagnosis Difficulty walking   PT Problem List Decreased strength;Decreased activity tolerance;Decreased balance;Decreased mobility  PT Treatment Interventions DME instruction;Gait training;Functional mobility training;Therapeutic activities;Therapeutic exercise;Balance training;Patient/family education   PT Goals (Current goals can be found in the Care Plan section) Acute Rehab PT Goals Patient Stated Goal: Return to prior function PT Goal Formulation: With patient Time For Goal Achievement: 08/11/13 Potential to Achieve Goals: Good  Frequency Min 2X/week   Barriers to discharge        Co-evaluation               End of Session Equipment Utilized During Treatment: Gait belt Activity Tolerance: Patient tolerated treatment well Patient left: in chair;with call bell/phone within reach;with chair alarm set Nurse Communication: Mobility status         Time: [id number] PT Time Calculation (min): 15 min   Charges:   PT Evaluation $Initial PT Evaluation Tier I: 1 Procedure PT Treatments $Gait Training: 8-22 mins   PT G Codes:          Paisley Grajeda 08/04/2013, 1:37 PM  [facility]  Nason Conradt PT [phone]

## 2013-08-04 NOTE — Progress Notes (Signed)
TX patient to 3E12 with belongings, called her niece to tell her that patient moved, patient stable.

## 2013-08-05 ENCOUNTER — Encounter (HOSPITAL_COMMUNITY)
Admission: EM | Disposition: A | Discharge status: Skilled Nursing Facility | Payer: Self-pay | Source: Home / Self Care | Attending: Internal Medicine

## 2013-08-05 DIAGNOSIS — I442 Atrioventricular block, complete: Secondary | ICD-10-CM

## 2013-08-05 HISTORY — PX: PACEMAKER INSERTION: SHX728

## 2013-08-05 HISTORY — PX: PERMANENT PACEMAKER INSERTION: SHX5480

## 2013-08-05 SURGERY — PERMANENT PACEMAKER INSERTION
Anesthesia: LOCAL

## 2013-08-05 MED ORDER — SODIUM CHLORIDE 0.9 % IV SOLN: INTRAVENOUS | Status: AC

## 2013-08-05 MED ORDER — CEFAZOLIN SODIUM-DEXTROSE 2-3 GM-% IV SOLR
2.0000 g | INTRAVENOUS | Status: DC
Start: 2013-08-05 — End: 2013-08-05
  Filled 2013-08-05: qty 50

## 2013-08-05 MED ORDER — FENTANYL CITRATE 0.05 MG/ML IJ SOLN
INTRAMUSCULAR | Status: AC
  Filled 2013-08-05: qty 2

## 2013-08-05 MED ORDER — SODIUM CHLORIDE 0.9 % IV SOLN: INTRAVENOUS | Status: DC

## 2013-08-05 MED ORDER — CEFAZOLIN SODIUM 1-5 GM-% IV SOLN
1.0000 g | Freq: Four times a day (QID) | INTRAVENOUS | Status: AC
  Administered 2013-08-05 – 2013-08-06 (×3): 1 g via INTRAVENOUS
  Filled 2013-08-05 (×3): qty 50

## 2013-08-05 MED ORDER — ONDANSETRON HCL 4 MG/2ML IJ SOLN: 4.0000 mg | Freq: Four times a day (QID) | INTRAMUSCULAR | Status: DC | PRN

## 2013-08-05 MED ORDER — MIDAZOLAM HCL 5 MG/5ML IJ SOLN
INTRAMUSCULAR | Status: AC
  Filled 2013-08-05: qty 5

## 2013-08-05 MED ORDER — ACETAMINOPHEN 325 MG PO TABS: 325.0000 mg | ORAL_TABLET | ORAL | Status: DC | PRN

## 2013-08-05 MED ORDER — LIDOCAINE HCL (PF) 1 % IJ SOLN
INTRAMUSCULAR | Status: AC
Start: 2013-08-05 — End: 2013-08-05
  Filled 2013-08-05: qty 60

## 2013-08-05 MED ORDER — METHYLPREDNISOLONE SODIUM SUCC 125 MG IJ SOLR
INTRAMUSCULAR | Status: AC
Start: 2013-08-05 — End: 2013-08-05
  Filled 2013-08-05: qty 2

## 2013-08-05 MED ORDER — CHLORHEXIDINE GLUCONATE 4 % EX LIQD
60.0000 mL | Freq: Once | CUTANEOUS | Status: DC
  Filled 2013-08-05: qty 60

## 2013-08-05 MED ORDER — MAGNESIUM HYDROXIDE 400 MG/5ML PO SUSP
30.0000 mL | Freq: Every day | ORAL | Status: DC | PRN
  Administered 2013-08-05: 30 mL via ORAL
  Filled 2013-08-05: qty 30

## 2013-08-05 MED ORDER — HEPARIN (PORCINE) IN NACL 2-0.9 UNIT/ML-% IJ SOLN
INTRAMUSCULAR | Status: AC
  Filled 2013-08-05: qty 500

## 2013-08-05 MED ORDER — GENTAMICIN SULFATE 40 MG/ML IJ SOLN
80.0000 mg | INTRAMUSCULAR | Status: DC
  Filled 2013-08-05: qty 2

## 2013-08-05 NOTE — CV Procedure (Signed)
Morgan Christensen [id number]  [id number]  Preop Dx: intermittent high grade heart block RBBB and bradycardia and syncope Postop Dx same/   Procedure: sdual chamber pacemaker insertion  After routine prep and drape, lidocaine was infiltrated in the prepectoral subclavicular region on the left side an incision was made and carried down to layer of  the prepectoral fascia using electrocautery and sharp dissection;  a pocket was formed similarly. Hemostasis was obtained.  After this, we turned our attention to gaining access to the extrathoracic,left subclavian vein. This was accomplished without difficulty and without the aspiration of air or puncture of the artery. 2 separate venipunctures were accomplished; guidewires were placed and retained and sequentially 7 French sheaths  were placed through which were passed a  [employer] 1948 ventricular lead, serial number [id number] and a   St Jude [id number] atrial lead, serial number [id number].  The ventricular lead was manipulated to the right ventricular apex where the bipolar R wave was 5.7, the pacing impedance was 899, the threshold was  0.6 V  @ 0.4 msec .  Current at threshold was   0.6 ma.  The right atrial lead was manipulated to the right atrial appendage where the bipolar P-wave was 2.6, the pacing impedance was 461, the threshold was 1.0 V @ 0.4 msec.  Current at threshold was2.0 ma  The leads were affixed to the prepectoral fascia and attached to a  St Jude  pulse generator.  Serial number [id number]  Hemostasis was obtained. The pocket was copiously irrigated with antibiotic containing saline solution. The leads and the pulse generator were placed in the pocket and affixed to the prepectoral fascia. The wound iwas then closed in 3 layers in normal fashion. The wound was washed dried and a DERMABOND DRESSING  was applied.  Needle count, sponge count  and instrument counts were correct at the end of the procedure .Marland Kitchen. The patient tolerated the procedure without  apparent complication.  Duke SalviaSteven C. Klein M.D. 08/05/2013 3:48 PM

## 2013-08-05 NOTE — Progress Notes (Signed)
Patient alert and oriented x4 throughout shift, vital signs stable.  Left chest pacemaker site clean, dry, and intact.  Small area of blood noted on dressing and marked.  Patient and niece (HPOA) denying any questions or concerns at this time.  Will continue to monitor.

## 2013-08-05 NOTE — Progress Notes (Signed)
Orthopedic Tech Progress Note Patient Details:  Morgan Christensen 1915/01/30 [id number] Patient has arm sling Patient ID: Morgan Christensen, female   DOB: 1915/01/30, 80 y.o.   MRN: [id number]   Morgan Christensen, Morgan Christensen 08/05/2013, 4:40 PM

## 2013-08-05 NOTE — Progress Notes (Signed)
CCMD notified RN that patient intermittently in third degree heart block when HR brady.  Dr. Graciela HusbandsKlein aware.  Patient asymptomatic.  Will continue to monitor.

## 2013-08-05 NOTE — Progress Notes (Signed)
Patient's heart rate sustaining in 30s, asypmptomatic.  Cardiology PA notified.  Will continue to monitor.

## 2013-08-05 NOTE — Progress Notes (Signed)
[facility] PROGRESS NOTE Interim History: 80 y.o. female with history of hypothyroidism hyperlipidemia had a fall at her nursing home. Patient states that she saw some bug in the bathroom and was trying to avoid it when she slipped and fell. She hit her face and developed a right periorbital hematoma and also a laceration of the right nares. This was sutured in the ER. CT head and maxillofacial is pending. Patient on exam is nonfocal. Patient's EKG showed second degree AV block type II Mobitz.  Assessment/Plan:  *Mobitz type 2 second degree atrioventricular block/bradycardia: - currently stable awaiting EP recommendations. - VS stable. She is not on any rate controlled medication.  Laceration of face with Periorbital trauma without ocular damage: - No sign of orbital or preseptal cellulitis - d/c antibiotics.  Hypothyroidism:  - High TSH. Check free T4  Hyperlipidemia  - on statins.   Anemia and thrombocytopenia: - closely follow CBC. Baseline counts not known.   Code Status: DO NOT RESUSCITATE.  Family Communication: None.  Disposition Plan: Admit to inpatient.     Consultants:  OPHTALMOLOGY  Procedures:  Ct head  Antibiotics:  Vanc and zosyn 6.15.2015  HPI/Subjective: No complains.  Objective: Filed Vitals:   08/04/13 1300 08/04/13 2225 08/05/13 0106 08/05/13 0445  BP:  163/58 135/57 138/49  Pulse:  62 65 57  Temp:  98.3 F (36.8 C) 97.6 F (36.4 C) 98 F (36.7 C)  TempSrc:  Oral Oral Oral  Resp:  18 18 17   Height:      Weight: 57.561 kg (126 lb 14.4 oz)   57.607 kg (127 lb)  SpO2:  100% 98% 97%    Intake/Output Summary (Last 24 hours) at 08/05/13 0946 Last data filed at 08/05/13 0830  Gross per 24 hour  Intake    870 ml  Output   1325 ml  Net   -455 ml   Filed Weights   08/03/13 2200 08/04/13 1300 08/05/13 0445  Weight: 60 kg (132 lb 4.4 oz) 57.561 kg (126 lb 14.4 oz) 57.607 kg (127 lb)    Exam:  General:  in no acute distress.   HEENT: left eye bruise no goiter.  Heart: Regular rate and rhythm, without murmurs, rubs, gallops.  Lungs: Good air movement, clear Abdomen: Soft, nontender, nondistended, positive bowel sounds.     Data Reviewed: Basic Metabolic Panel:  Recent Labs Lab 08/03/13 1920 08/03/13 1925 08/04/13 0313  NA 140 139 138  K 4.6 4.2 4.0  CL 102 101 100  CO2 24  --  22  GLUCOSE 107* 109* 87  BUN 14 13 12   CREATININE 0.66 0.70 0.61  CALCIUM 9.7  --  9.4   Liver Function Tests:  Recent Labs Lab 08/03/13 1920  AST 17  ALT 8  ALKPHOS 59  BILITOT <0.2*  PROT 7.0  ALBUMIN 4.0   No results found for this basename: LIPASE, AMYLASE,  in the last 168 hours No results found for this basename: AMMONIA,  in the last 168 hours CBC:  Recent Labs Lab 08/03/13 1920 08/03/13 1925 08/04/13 0313  WBC 8.4  --  8.6  NEUTROABS 6.2  --   --   HGB 11.4* 13.3 11.7*  HCT 38.1 39.0 38.5  MCV 77.3*  --  76.8*  PLT 107*  --  101*   Cardiac Enzymes:  Recent Labs Lab 08/03/13 1920  TROPONINI <0.30   BNP (last 3 results) No results found for this basename: PROBNP,  in the last 8760 hours  CBG: No results found for this basename: GLUCAP,  in the last 168 hours  Recent Results (from the past 240 hour(s))  MRSA PCR SCREENING     Status: None   Collection Time    08/03/13 10:36 PM      Result Value Ref Range Status   MRSA by PCR NEGATIVE  NEGATIVE Final   Comment:            The GeneXpert MRSA Assay (FDA     approved for NASAL specimens     only), is one component of a     comprehensive MRSA colonization     surveillance program. It is not     intended to diagnose MRSA     infection nor to guide or     monitor treatment for     MRSA infections.     Studies: Ct Head Wo Contrast  08/03/2013   CLINICAL DATA:  Fall water trying to get up from the toilet. Injury to right side of the face.  EXAM: CT HEAD WITHOUT CONTRAST  CT MAXILLOFACIAL WITHOUT CONTRAST  TECHNIQUE: Multidetector CT  imaging of the head and maxillofacial structures were performed using the standard protocol without intravenous contrast. Multiplanar CT image reconstructions of the maxillofacial structures were also generated.  COMPARISON:  None.  FINDINGS: CT HEAD FINDINGS  The study is moderately degraded by patient motion. Moderate generalized atrophy is present. Periventricular white matter hypoattenuation is evident bilaterally. No acute cortical infarct, hemorrhage, or mass lesion. The ventricles are proportionate to the degree of atrophy.  Fluid is present in the left sphenoid sinus. A posterior right ethmoid air cell is opacified. The remaining paranasal sinuses and the mastoid air cells are clear. The osseous skull is intact.  CT MAXILLOFACIAL FINDINGS  Extensive soft tissue swelling is noted along the right intraorbital soft tissue adjacent to the right maxillary sinus and right nasal bone without an underlying fracture. The nasal bones are intact. The nasal cavity is clear. The ostiomeatal complex is patent bilaterally with a with small ethmoid bulla on the left.  The patient is status post bilateral lens extractions. The globes and orbits are otherwise intact.  The posterior right ethmoid air cell is near completely opacified. Fluid is present in the left sphenoid sinus.  IMPRESSION: 1. No acute intracranial abnormality. 2. Moderate atrophy and diffuse white matter disease. 3. Posterior right ethmoid and left sphenoid sinus disease. 4. Extensive soft tissue swelling along the inferior aspect of the right orbit without an orbital injury. 5. Gas is present within the soft tissue swelling compatible with infection. 6. No focal osseous abnormality. 7. Posterior right ethmoid and left sphenoid sinus disease.   Electronically Signed   By: Gennette Pachris  Mattern M.D.   On: 08/03/2013 21:53   Ct Maxillofacial Wo Cm  08/03/2013   CLINICAL DATA:  Fall water trying to get up from the toilet. Injury to right side of the face.  EXAM: CT  HEAD WITHOUT CONTRAST  CT MAXILLOFACIAL WITHOUT CONTRAST  TECHNIQUE: Multidetector CT imaging of the head and maxillofacial structures were performed using the standard protocol without intravenous contrast. Multiplanar CT image reconstructions of the maxillofacial structures were also generated.  COMPARISON:  None.  FINDINGS: CT HEAD FINDINGS  The study is moderately degraded by patient motion. Moderate generalized atrophy is present. Periventricular white matter hypoattenuation is evident bilaterally. No acute cortical infarct, hemorrhage, or mass lesion. The ventricles are proportionate to the degree of atrophy.  Fluid is present in the left sphenoid  sinus. A posterior right ethmoid air cell is opacified. The remaining paranasal sinuses and the mastoid air cells are clear. The osseous skull is intact.  CT MAXILLOFACIAL FINDINGS  Extensive soft tissue swelling is noted along the right intraorbital soft tissue adjacent to the right maxillary sinus and right nasal bone without an underlying fracture. The nasal bones are intact. The nasal cavity is clear. The ostiomeatal complex is patent bilaterally with a with small ethmoid bulla on the left.  The patient is status post bilateral lens extractions. The globes and orbits are otherwise intact.  The posterior right ethmoid air cell is near completely opacified. Fluid is present in the left sphenoid sinus.  IMPRESSION: 1. No acute intracranial abnormality. 2. Moderate atrophy and diffuse white matter disease. 3. Posterior right ethmoid and left sphenoid sinus disease. 4. Extensive soft tissue swelling along the inferior aspect of the right orbit without an orbital injury. 5. Gas is present within the soft tissue swelling compatible with infection. 6. No focal osseous abnormality. 7. Posterior right ethmoid and left sphenoid sinus disease.   Electronically Signed   By: Gennette Pac M.D.   On: 08/03/2013 21:53    Scheduled Meds: . calcium-vitamin D  1 tablet Oral Q  breakfast  . docusate sodium  200 mg Oral QHS  . fluticasone  2 spray Each Nare Daily  . levothyroxine  25 mcg Oral QAC breakfast  . pantoprazole  40 mg Oral QAC breakfast  . polyethylene glycol  17 g Oral Daily  . sertraline  25 mg Oral Daily  . simvastatin  20 mg Oral QHS  . sodium chloride  3 mL Intravenous Q12H   Continuous Infusions: . sodium chloride 10 mL/hr at 08/04/13 0600     FELIZ Rosine Beat  [facility] Pager [phone]. If 8PM-8AM, please contact night-coverage at [email or web address], password [facility] 08/05/2013, 9:46 AM  LOS: 2 days    **Disclaimer: This note may have been dictated with voice recognition software. Similar sounding words can inadvertently be transcribed and this note may contain transcription errors which may not have been corrected upon publication of note.**

## 2013-08-05 NOTE — Progress Notes (Signed)
Subjective:  Patient continues to have intermittent Mobitz II AV Block. No chest pain or dyspnea or syncope.  Objective:  Vital Signs in the last 24 hours: Temp:  [96.6 F (35.9 C)-98.3 F (36.8 C)] 98 F (36.7 C) (06/16 0445) Pulse Rate:  [45-65] 57 (06/16 0445) Resp:  [14-18] 17 (06/16 0445) BP: (135-180)/(49-70) 138/49 mmHg (06/16 0445) SpO2:  [94 %-100 %] 97 % (06/16 0445) Weight:  [57.561 kg (126 lb 14.4 oz)-57.607 kg (127 lb)] 57.607 kg (127 lb) (06/16 0445)  Intake/Output from previous day: 06/15 0701 - 06/16 0700 In: 670 [P.O.:640; I.V.:30] Out: 1325 [Urine:1325]  Physical Exam: General appearance: alert, cooperative, appears stated age and no distress  Neck: no carotid bruit, no JVD, supple, symmetrical, trachea midline and thyroid not enlarged, symmetric, no tenderness/mass/nodules bruising and mild hematoma underneath the right eye evident. Right nares with a stitch.  Lungs: clear to auscultation bilaterally  Heart: regular rate and rhythm, S1, S2 normal, no murmur, click, rub or gallop  Abdomen: soft, non-tender; bowel sounds normal; no masses, no organomegaly  Extremities: extremities normal, atraumatic, no cyanosis or edema  Pulses: carotid pulses normal, femoral pulse normal, pedal pulse faint bilaterally. Capillary refill normal  Neurologic: Grossly normal     Lab Results: BMP  Recent Labs  08/03/13 1920 08/03/13 1925 08/04/13 0313  NA 140 139 138  K 4.6 4.2 4.0  CL 102 101 100  CO2 24  --  22  GLUCOSE 107* 109* 87  BUN 14 13 12   CREATININE 0.66 0.70 0.61  CALCIUM 9.7  --  9.4  GFRNONAA 72*  --  74*  GFRAA 84*  --  86*    CBC  Recent Labs Lab 08/03/13 1920  08/04/13 0313  WBC 8.4  --  8.6  RBC 4.93  --  5.01  HGB 11.4*  < > 11.7*  HCT 38.1  < > 38.5  PLT 107*  --  101*  MCV 77.3*  --  76.8*  MCH 23.1*  --  23.4*  MCHC 29.9*  --  30.4  RDW 14.7  --  14.7  LYMPHSABS 1.6  --   --   MONOABS 0.5  --   --   EOSABS 0.1  --   --   BASOSABS  0.0  --   --   < > = values in this interval not displayed.  HEMOGLOBIN A1C No results found for this basename: HGBA1C, MPG    Cardiac Panel (last 3 results)  Recent Labs  08/03/13 1920  TROPONINI <0.30    BNP (last 3 results) No results found for this basename: PROBNP,  in the last 8760 hours  TSH  Recent Labs  08/04/13 0313  TSH 14.270*    CHOLESTEROL No results found for this basename: CHOL,  in the last 8760 hours  Hepatic Function Panel  Recent Labs  08/03/13 1920  PROT 7.0  ALBUMIN 4.0  AST 17  ALT 8  ALKPHOS 59  BILITOT <0.2*     Cardiac Studies:  EKG: 08/03/2013: Sinus rhythm with first degree AV block, left axis deviation, left anterior fascicular block, right bundle branch block, trifascicular block. Nonspecific T. Abnormality.  Tele: Sinus with 1st degree. Intermittent Mobitz II AV Block  Assessment/Plan:  1. Syncope due to high degree AV Block 2. Abnormal TSH probably acute reaction. Needs F/U as OP when stable. ? T3, T4, leave it to primary team.  Rec: PPM implant. I also discussed with the patient and explained the risk  of recurrent events, hip fracture, etc. Patient states she will proceed with pacer implant. D/W EP.    Pamella PertGANJI,JAGADEESH R, M.D. 08/05/2013, 10:04 AM [facility] Pager: [phone] Office: [phone] If no answer: [phone]

## 2013-08-05 NOTE — Progress Notes (Signed)
Patient: Morgan Christensen Date of Encounter: 08/05/2013, 9:59 AM Admit date: 08/03/2013     Subjective  Ms. Morgan Christensen has no complaints this AM. She denies CP or SOB.   Objective  Physical Exam: Vitals: BP 138/49  Pulse 57  Temp(Src) 98 F (36.7 C) (Oral)  Resp 17  Ht 5\' 3"  (1.6 m)  Wt 127 lb (57.607 kg)  BMI 22.50 kg/m2  SpO2 97% General: Well developed, well appearing 80 year old female in no acute distress. Neck: Supple. JVD not elevated. Lungs: Clear bilaterally to auscultation without wheezes, rales, or rhonchi. Breathing is unlabored. Heart: Bradycardic S1 S2 without murmurs, rubs, or gallops.  Abdomen: Soft, non-distended. Extremities: No clubbing or cyanosis. No edema.  Distal pedal pulses are 2+ and equal bilaterally. Neuro: Alert and oriented X 3. Moves all extremities spontaneously. No focal deficits.  Intake/Output:  Intake/Output Summary (Last 24 hours) at 08/05/13 0959 Last data filed at 08/05/13 0830  Gross per 24 hour  Intake    870 ml  Output   1325 ml  Net   -455 ml    Inpatient Medications:  . calcium-vitamin D  1 tablet Oral Q breakfast  . docusate sodium  200 mg Oral QHS  . fluticasone  2 spray Each Nare Daily  . levothyroxine  25 mcg Oral QAC breakfast  . pantoprazole  40 mg Oral QAC breakfast  . polyethylene glycol  17 g Oral Daily  . sertraline  25 mg Oral Daily  . simvastatin  20 mg Oral QHS  . sodium chloride  3 mL Intravenous Q12H   . sodium chloride 10 mL/hr at 08/04/13 0600    Labs:  Recent Labs  08/03/13 1920 08/03/13 1925 08/04/13 0313  NA 140 139 138  K 4.6 4.2 4.0  CL 102 101 100  CO2 24  --  22  GLUCOSE 107* 109* 87  BUN 14 13 12   CREATININE 0.66 0.70 0.61  CALCIUM 9.7  --  9.4    Recent Labs  08/03/13 1920  AST 17  ALT 8  ALKPHOS 59  BILITOT <0.2*  PROT 7.0  ALBUMIN 4.0    Recent Labs  08/03/13 1920 08/03/13 1925 08/04/13 0313  WBC 8.4  --  8.6  NEUTROABS 6.2  --   --   HGB 11.4* 13.3 11.7*  HCT 38.1  39.0 38.5  MCV 77.3*  --  76.8*  PLT 107*  --  101*    Recent Labs  08/03/13 1920  TROPONINI <0.30    Recent Labs  08/04/13 0313  TSH 14.270*  Free T4 0.82  Radiology/Studies: Ct Head Wo Contrast  08/03/2013   CLINICAL DATA:  Fall water trying to get up from the toilet. Injury to right side of the face.  EXAM: CT HEAD WITHOUT CONTRAST  CT MAXILLOFACIAL WITHOUT CONTRAST  TECHNIQUE: Multidetector CT imaging of the head and maxillofacial structures were performed using the standard protocol without intravenous contrast. Multiplanar CT image reconstructions of the maxillofacial structures were also generated.  COMPARISON:  None.  FINDINGS: CT HEAD FINDINGS  The study is moderately degraded by patient motion. Moderate generalized atrophy is present. Periventricular white matter hypoattenuation is evident bilaterally. No acute cortical infarct, hemorrhage, or mass lesion. The ventricles are proportionate to the degree of atrophy.  Fluid is present in the left sphenoid sinus. A posterior right ethmoid air cell is opacified. The remaining paranasal sinuses and the mastoid air cells are clear. The osseous skull is intact.  CT MAXILLOFACIAL FINDINGS  Extensive soft tissue swelling is noted along the right intraorbital soft tissue adjacent to the right maxillary sinus and right nasal bone without an underlying fracture. The nasal bones are intact. The nasal cavity is clear. The ostiomeatal complex is patent bilaterally with a with small ethmoid bulla on the left.  The patient is status post bilateral lens extractions. The globes and orbits are otherwise intact.  The posterior right ethmoid air cell is near completely opacified. Fluid is present in the left sphenoid sinus.  IMPRESSION: 1. No acute intracranial abnormality. 2. Moderate atrophy and diffuse white matter disease. 3. Posterior right ethmoid and left sphenoid sinus disease. 4. Extensive soft tissue swelling along the inferior aspect of the right  orbit without an orbital injury. 5. Gas is present within the soft tissue swelling compatible with infection. 6. No focal osseous abnormality. 7. Posterior right ethmoid and left sphenoid sinus disease.   Electronically Signed   By: Gennette Pachris  Mattern M.D.   On: 08/03/2013 21:53   Ct Maxillofacial Wo Cm  08/03/2013   CLINICAL DATA:  Fall water trying to get up from the toilet. Injury to right side of the face.  EXAM: CT HEAD WITHOUT CONTRAST  CT MAXILLOFACIAL WITHOUT CONTRAST  TECHNIQUE: Multidetector CT imaging of the head and maxillofacial structures were performed using the standard protocol without intravenous contrast. Multiplanar CT image reconstructions of the maxillofacial structures were also generated.  COMPARISON:  None.  FINDINGS: CT HEAD FINDINGS  The study is moderately degraded by patient motion. Moderate generalized atrophy is present. Periventricular white matter hypoattenuation is evident bilaterally. No acute cortical infarct, hemorrhage, or mass lesion. The ventricles are proportionate to the degree of atrophy.  Fluid is present in the left sphenoid sinus. A posterior right ethmoid air cell is opacified. The remaining paranasal sinuses and the mastoid air cells are clear. The osseous skull is intact.  CT MAXILLOFACIAL FINDINGS  Extensive soft tissue swelling is noted along the right intraorbital soft tissue adjacent to the right maxillary sinus and right nasal bone without an underlying fracture. The nasal bones are intact. The nasal cavity is clear. The ostiomeatal complex is patent bilaterally with a with small ethmoid bulla on the left.  The patient is status post bilateral lens extractions. The globes and orbits are otherwise intact.  The posterior right ethmoid air cell is near completely opacified. Fluid is present in the left sphenoid sinus.  IMPRESSION: 1. No acute intracranial abnormality. 2. Moderate atrophy and diffuse white matter disease. 3. Posterior right ethmoid and left sphenoid  sinus disease. 4. Extensive soft tissue swelling along the inferior aspect of the right orbit without an orbital injury. 5. Gas is present within the soft tissue swelling compatible with infection. 6. No focal osseous abnormality. 7. Posterior right ethmoid and left sphenoid sinus disease.   Electronically Signed   By: Gennette Pachris  Mattern M.D.   On: 08/03/2013 21:53    Echocardiogram: pending Telemetry: persistent Mobitz II AV block, rate in upper 30s - 40 bpm   Assessment and Plan  Mobitz II AV block  Sinus bradycardia  Hypothyroidism  Ms. Morgan Christensen will need PPM. Discussed PPM again with Ms. Morgan Christensen today. Risks and benefits to PPM implantation were reviewed. These risks include, but are not limited to, bleeding, infection, pneumothorax, perforation, tamponade, vascular damage, renal failure, MI, stroke, death, and lead dislodgement. Ms. Morgan Christensen expressed verbal understanding and agrees to proceed. MD to advise regarding timing of procedure.   Signed, Rick DuffEDMISTEN, BROOKE PA-C  An easy electrical decision  with 2AVB( i think type 1 not 2) with RBBB and sinus rates< 40 and functional rates<30  What is amazing to me is the absecnce of clearly attributable symptoms  The pt has been somewhat disinclined to receive a pacemaker although we have several class 2a indications and if i am wrong and it is type 2 2AVB with RBBB then class 1 indications as well  I will meet with her and her HCPOA today.   AFter further discussions the family would lke to proceed with pacing  i have reviewed that i am not sure what symptoms are likely mitigated by the procedure but there should be significant attenuation of risk of falls

## 2013-08-05 NOTE — Progress Notes (Signed)
Patient left unit for pacemaker placement this afternoon.  Informed consent signed and in chart.  Preop meds, including hibiclens unavailable from pharmacy by the time RNs arrived to take patient down, meds tubed to cath lab at their request.  IV team at bedside, but per RNs, second IV will be placed in right arm in pre-procedural area and preop fluids to be started once there.

## 2013-08-06 ENCOUNTER — Inpatient Hospital Stay (HOSPITAL_COMMUNITY): Payer: PRIVATE HEALTH INSURANCE

## 2013-08-06 ENCOUNTER — Other Ambulatory Visit: Payer: Self-pay | Admitting: *Deleted

## 2013-08-06 ENCOUNTER — Encounter (HOSPITAL_COMMUNITY): Payer: Self-pay | Admitting: Internal Medicine

## 2013-08-06 DIAGNOSIS — K219 Gastro-esophageal reflux disease without esophagitis: Secondary | ICD-10-CM

## 2013-08-06 DIAGNOSIS — K5900 Constipation, unspecified: Secondary | ICD-10-CM

## 2013-08-06 DIAGNOSIS — Z950 Presence of cardiac pacemaker: Secondary | ICD-10-CM

## 2013-08-06 DIAGNOSIS — F329 Major depressive disorder, single episode, unspecified: Secondary | ICD-10-CM

## 2013-08-06 DIAGNOSIS — I10 Essential (primary) hypertension: Secondary | ICD-10-CM

## 2013-08-06 DIAGNOSIS — F3289 Other specified depressive episodes: Secondary | ICD-10-CM

## 2013-08-06 MED ORDER — LEVOTHYROXINE SODIUM 50 MCG PO TABS
50.0000 ug | ORAL_TABLET | Freq: Every day | ORAL | Status: DC
  Administered 2013-08-06: 50 ug via ORAL
  Filled 2013-08-06: qty 1

## 2013-08-06 MED ORDER — LEVOTHYROXINE SODIUM 50 MCG PO TABS: 50.0000 ug | ORAL_TABLET | Freq: Every day | ORAL | Status: DC

## 2013-08-06 MED ORDER — HYDROCODONE-ACETAMINOPHEN 5-325 MG PO TABS: 1.0000 | ORAL_TABLET | Freq: Three times a day (TID) | ORAL | Status: DC | PRN

## 2013-08-06 MED ORDER — TRAMADOL HCL 50 MG PO TABS: 50.0000 mg | ORAL_TABLET | Freq: Four times a day (QID) | ORAL | Status: DC | PRN

## 2013-08-06 MED ORDER — TRAMADOL HCL 50 MG PO TABS: ORAL_TABLET | ORAL | Status: DC

## 2013-08-06 NOTE — Telephone Encounter (Signed)
[facility] 

## 2013-08-06 NOTE — Progress Notes (Signed)
An EKG was obtained and placed in the patient's chart.

## 2013-08-06 NOTE — Progress Notes (Signed)
Patient Name: Morgan Christensen      SUBJECTIVE without cp or sob  Past Medical History  Diagnosis Date  . Osteoporosis   . Hypothyroidism   . Hyperlipidemia   . Inflammatory arthropathy   . Hearing loss   . Gait abnormality   . Mobitz type 2 second degree atrioventricular block 08/03/2013  . Pacemaker- St Jude 08/06/2013    Scheduled Meds:  Scheduled Meds: . calcium-vitamin D  1 tablet Oral Q breakfast  . docusate sodium  200 mg Oral QHS  . fluticasone  2 spray Each Nare Daily  . levothyroxine  25 mcg Oral QAC breakfast  . pantoprazole  40 mg Oral QAC breakfast  . polyethylene glycol  17 g Oral Daily  . sertraline  25 mg Oral Daily  . simvastatin  20 mg Oral QHS  . sodium chloride  3 mL Intravenous Q12H   Continuous Infusions: . sodium chloride 10 mL/hr (08/05/13 1945)   acetaminophen, HYDROcodone-acetaminophen, magnesium hydroxide, ondansetron (ZOFRAN) IV, traMADol    PHYSICAL EXAM Filed Vitals:   08/05/13 2000 08/05/13 2100 08/06/13 0320 08/06/13 0558  BP: 139/53 120/41 140/46 153/46  Pulse: 38 37 32 97  Temp:  98.6 F (37 C) 98.2 F (36.8 C) 97.7 F (36.5 C)  TempSrc:  Oral Oral Oral  Resp:   18 18  Height:      Weight:   126 lb 11.5 oz (57.48 kg)   SpO2:  98% 98% 95%  Well developed and nourished in no acute distress HENT normal Neck supple  Clear No hematoma Regular rate and rhythm, no murmurs or gallops Abd-soft with active BS No Clubbing cyanosis edema Skin-warm and dry A & Oriented  Grossly normal sensory and motor function  TELEMETRY: Reviewed telemetry pt in  Heigh grade heart block  CXR  Stable lead position   Intake/Output Summary (Last 24 hours) at 08/06/13 0756 Last data filed at 08/06/13 0622  Gross per 24 hour  Intake    903 ml  Output    950 ml  Net    -47 ml    LABS: Basic Metabolic Panel:  Recent Labs Lab 08/03/13 1920 08/03/13 1925 08/04/13 0313  NA 140 139 138  K 4.6 4.2 4.0  CL 102 101 100  CO2 24  --   22  GLUCOSE 107* 109* 87  BUN 14 13 12   CREATININE 0.66 0.70 0.61  CALCIUM 9.7  --  9.4   Cardiac Enzymes:  Recent Labs  08/03/13 1920  TROPONINI <0.30   CBC:  Recent Labs Lab 08/03/13 1920 08/03/13 1925 08/04/13 0313  WBC 8.4  --  8.6  NEUTROABS 6.2  --   --   HGB 11.4* 13.3 11.7*  HCT 38.1 39.0 38.5  MCV 77.3*  --  76.8*  PLT 107*  --  101*   PROTIME: No results found for this basename: LABPROT, INR,  in the last 72 hours Liver Function Tests:  Recent Labs  08/03/13 1920  AST 17  ALT 8  ALKPHOS 59  BILITOT <0.2*  PROT 7.0  ALBUMIN 4.0   No results found for this basename: LIPASE, AMYLASE,  in the last 72 hours BNP: BNP (last 3 results) No results found for this basename: PROBNP,  in the last 8760 hours D-Dimer: No results found for this basename: DDIMER,  in the last 72 hours Hemoglobin A1C: No results found for this basename: HGBA1C,  in the last 72 hours Fasting Lipid Panel: No  results found for this basename: CHOL, HDL, LDLCALC, TRIG, CHOLHDL, LDLDIRECT,  in the last 72 hours Thyroid Function Tests:  Recent Labs  08/04/13 0313  TSH 14.270*   Anemia Panel: No results found for this basename: VITAMINB12, FOLATE, FERRITIN, TIBC, IRON, RETICCTPCT,  in the last 72 hours   Device Interrogation:  Device funciton normal  Reprogrammed to DDD   ASSESSMENT AND PLAN:  Principal Problem:   Mobitz type 2 second degree atrioventricular block Active Problems:   Hypothyroidism   Hyperlipidemia   Laceration of face   Pacemaker- St Jude  Will arrange wound check and discharge pre primary services  Instructions given    Signed, Sherryl MangesSteven Klein MD  08/06/2013

## 2013-08-06 NOTE — Progress Notes (Signed)
Attempted to call report to facility, nurse to call back for report. Rodney Boozeasha

## 2013-08-06 NOTE — Discharge Instructions (Signed)
° °  Supplemental Discharge Instructions for  Pacemaker/Defibrillator Patients  Activity No heavy lifting or vigorous activity with your left/right arm for 6 to 8 weeks.  Do not raise your left/right arm above your head for one week.  Gradually raise your affected arm as drawn below.           06/20                      06/21                       06/22                      06/23       NO DRIVING  WOUND CARE   Keep the wound area clean and dry.  You may shower but no tub bath or swimming pool for 10-14 days until wound completely healed.   The Dermabond (glue) on your wound will fall off on its own; do not pull it off.  No bandage is needed on the site.  DO NOT apply any creams, oils, or ointments to the wound area.   If you notice any drainage or discharge from the wound, any swelling or bruising at the site, or you develop a fever > 101? F after you are discharged home, call the office at once.  Special Instructions   You are still able to use cellular telephones; use the ear opposite the side where you have your pacemaker/defibrillator.  Avoid carrying your cellular phone near your device.   When traveling through airports, show security personnel your identification card to avoid being screened in the metal detectors.  Ask the security personnel to use the hand wand.   Avoid arc welding equipment, MRI testing (magnetic resonance imaging), TENS units (transcutaneous nerve stimulators).  Call the office for questions about other devices.   Avoid electrical appliances that are in poor condition or are not properly grounded.   Microwave ovens are safe to be near or to operate.

## 2013-08-06 NOTE — Progress Notes (Signed)
The patient's IV infiltrated this morning and was removed.  IV team was paged for the restart.  She did not have any complaints of pain overnight and her pacemaker dressing is unchanged from the beginning of the shift yesterday.  Her HR stayed mostly in the mid-to-upper 30s overnight.  [facility]

## 2013-08-06 NOTE — Progress Notes (Signed)
Clinical Social Worker facilitated patient discharge including contacting patient,  Family (neice- BaristaArnetta Beavely) and facility to confirm patient discharge plans.  Clinical information faxed to facility and family agreeable with plan. CSW arranged ambulance transport via [facility] to . RN to call report prior to discharge.   Clinical Social Worker will sign off for now as social work intervention is no longer needed. Please consult us again if new need arises.  Lovette Clicheonna Crowder, LCSWA  [phone]

## 2013-08-06 NOTE — Discharge Summary (Signed)
Physician Discharge Summary  Morgan Christensen BJY:[id number] DOB: 02-01-1915 DOA: 08/03/2013  PCP: No primary provider on file.  Admit date: 08/03/2013 Discharge date: 08/06/2013  Time spent: >30 minutes  Recommendations for Outpatient Follow-up:  CBC to follow Hgb and platelets in 1-2 weeks Thyroid function test to be repeated in 4 weeks Reassess BP and if need start antihypertensive regimen (avoid agents that could affect HR)  Discharge Diagnoses:  Principal Problem:   Mobitz type 2 second degree atrioventricular block Active Problems:   Hypothyroidism   Hyperlipidemia   Laceration of face   Pacemaker- St Jude Depression Constipation GERD   Discharge Condition: stable and improved. Will discharge back to SNF and will follow up with cardiology (Dr. Graciela HusbandsKlein) as instructed.   Diet recommendation: low sodium/heart healthy diet  Filed Weights   08/04/13 1300 08/05/13 0445 08/06/13 0320  Weight: 57.561 kg (126 lb 14.4 oz) 57.607 kg (127 lb) 57.48 kg (126 lb 11.5 oz)    History of present illness:  80 y.o. female with history of hypothyroidism hyperlipidemia had a fall at her nursing home. Patient states that she saw some bug in the bathroom and was trying to avoid it when she slipped and fell. She hit her face and developed a right periorbital hematoma and also a laceration of the right nares. This was sutured in the ER. CT head and maxillofacial is pending. Patient on exam is nonfocal. Patient's EKG showed second degree AV block type II Mobitz. On-call cardiologist Dr. Jacinto HalimGanji, was consulted and at this time family was stating that patient has had this before. Per Dr. Jacinto HalimGanji patient is to be admitted and observed. Patient otherwise denies any chest pain nausea vomiting abdominal pain diarrhea. Patient denies any visual symptoms and is able to move her eyes without any restriction.    Hospital Course:  *Mobitz type 2 second degree atrioventricular block/bradycardia:  -stable now -status  post pacemaker implantation -will follow up with cardiology as an outpatient  Laceration of face with Periorbital trauma without ocular damage:  - No sign of orbital or preseptal cellulitis  - no need for antibiotics.   Hypothyroidism:  - High TSH. -synthroid adjusted -will need thyroid test recheck in 4 weeks   HTN -fairly well controlled -continue low sodium diet  Hyperlipidemia  -continue statins.   Anemia and thrombocytopenia:  - appears to be anemia of chronic disease -follow Hgb and platelets trend as an outpatient  Depression -continue zoloft  GERD -continue PPI   Procedures:  See below for x-ray reports  S/p ST-Jude pacemaker implantation   Consultations:  Cardiology (Dr. Graciela HusbandsKlein)  Ophthalmology   Discharge Exam: Filed Vitals:   08/06/13 0558  BP: 153/46  Pulse: 97  Temp: 97.7 F (36.5 C)  Resp: 18   YQM:VHQIGEN:Well developed and nourished in no acute distress  HENT: healing laceration in right side of her face/nostril Neck:supple and w/o JVD Lungs: Clear to auscultation bilaterally Chest/heart: No hematoma; Regular rate and rhythm, no murmurs or gallops  Abd: soft with active BS  Extremities: No Clubbing, cyanosis or edema  Skin: warm and dry  Neuro: hard of hearing; otherwise exam Grossly normal; [name]    Discharge Instructions You were cared for by a hospitalist during your hospital stay. If you have any questions about your discharge medications or the care you received while you were in the hospital after you are discharged, you can call the unit and asked to speak with the hospitalist on call if the hospitalist that took care of  you is not available. Once you are discharged, your primary care physician will handle any further medical issues. Please note that NO REFILLS for any discharge medications will be authorized once you are discharged, as it is imperative that you return to your primary care physician (or establish a relationship with a  primary care physician if you do not have one) for your aftercare needs so that they can reassess your need for medications and monitor your lab values.  Discharge Instructions   Diet - low sodium heart healthy    Complete by:  As directed      Discharge instructions    Complete by:  As directed   Take medications as prescribed Follow up with Dr. Graciela Husbands as instructed Follow a low sodium/heart healthy diet (less than 2 grams daily)            Medication List         calcium-vitamin D 500-200 MG-UNIT per tablet  Commonly known as:  OSCAL WITH D  Take 1 tablet by mouth daily.     docusate calcium 240 MG capsule  Commonly known as:  SURFAK  Take 240 mg by mouth at bedtime.     HYDROcodone-acetaminophen 5-325 MG per tablet  Commonly known as:  NORCO/VICODIN  Take 1 tablet by mouth every 8 (eight) hours as needed for moderate pain.     levothyroxine 50 MCG tablet  Commonly known as:  SYNTHROID, LEVOTHROID  Take 1 tablet (50 mcg total) by mouth daily before breakfast.     mometasone 50 MCG/ACT nasal spray  Commonly known as:  NASONEX  Place 2 sprays into the nose daily.     pantoprazole 40 MG tablet  Commonly known as:  PROTONIX  Take 40 mg by mouth daily before breakfast.     polyethylene glycol packet  Commonly known as:  MIRALAX / GLYCOLAX  Take 17 g by mouth daily. Mix in 4-8 oz of liquid and drink     sertraline 25 MG tablet  Commonly known as:  ZOLOFT  Take 25 mg by mouth daily.     traMADol 50 MG tablet  Commonly known as:  ULTRAM  Take 1 tablet (50 mg total) by mouth every 6 (six) hours as needed for moderate pain (pain).     ZOCOR 20 MG tablet  Generic drug:  simvastatin  Take 20 mg by mouth at bedtime.       Allergies  Allergen Reactions  . Aspirin Nausea And Vomiting  . Shellfish-Derived Products Nausea And Vomiting       Follow-up Information   Follow up with [facility] On 08/13/2013. (At 4:30 PM for wound check)     Specialty:  Cardiology   Contact information:   [address], [address] [place] [address] [phone]      Follow up with Sherryl Manges, MD On 11/11/2013. (At 3:30 PM)    Specialty:  Cardiology   Contact information:   [address] [place] [address] [phone]        The results of significant diagnostics from this hospitalization (including imaging, microbiology, ancillary and laboratory) are listed below for reference.    Significant Diagnostic Studies: Dg Chest 2 View  08/06/2013   CLINICAL DATA:  Status post implant.  EXAM: CHEST  2 VIEW  COMPARISON:  None.  FINDINGS: Cardiomediastinal silhouette appears normal. No pneumothorax or pleural effusion is noted. Left-sided pacemaker is noted with leads in grossly good position.  Bony thorax is intact. Minimal central pulmonary vascular congestion is noted.  IMPRESSION: Left-sided pacemaker is noted with leads in grossly good position. No pneumothorax is noted. Minimal central pulmonary vascular congestion is noted.   Electronically Signed   By: Roque LiasJames  Green M.D.   On: 08/06/2013 08:12   Ct Head Wo Contrast  08/03/2013   CLINICAL DATA:  Fall water trying to get up from the toilet. Injury to right side of the face.  EXAM: CT HEAD WITHOUT CONTRAST  CT MAXILLOFACIAL WITHOUT CONTRAST  TECHNIQUE: Multidetector CT imaging of the head and maxillofacial structures were performed using the standard protocol without intravenous contrast. Multiplanar CT image reconstructions of the maxillofacial structures were also generated.  COMPARISON:  None.  FINDINGS: CT HEAD FINDINGS  The study is moderately degraded by patient motion. Moderate generalized atrophy is present. Periventricular white matter hypoattenuation is evident bilaterally. No acute cortical infarct, hemorrhage, or mass lesion. The ventricles are proportionate to the degree of atrophy.  Fluid is present in the left sphenoid sinus. A posterior right ethmoid air  cell is opacified. The remaining paranasal sinuses and the mastoid air cells are clear. The osseous skull is intact.  CT MAXILLOFACIAL FINDINGS  Extensive soft tissue swelling is noted along the right intraorbital soft tissue adjacent to the right maxillary sinus and right nasal bone without an underlying fracture. The nasal bones are intact. The nasal cavity is clear. The ostiomeatal complex is patent bilaterally with a with small ethmoid bulla on the left.  The patient is status post bilateral lens extractions. The globes and orbits are otherwise intact.  The posterior right ethmoid air cell is near completely opacified. Fluid is present in the left sphenoid sinus.  IMPRESSION: 1. No acute intracranial abnormality. 2. Moderate atrophy and diffuse white matter disease. 3. Posterior right ethmoid and left sphenoid sinus disease. 4. Extensive soft tissue swelling along the inferior aspect of the right orbit without an orbital injury. 5. Gas is present within the soft tissue swelling compatible with infection. 6. No focal osseous abnormality. 7. Posterior right ethmoid and left sphenoid sinus disease.   Electronically Signed   By: Gennette Pachris  Mattern M.D.   On: 08/03/2013 21:53   Ct Maxillofacial Wo Cm  08/03/2013   CLINICAL DATA:  Fall water trying to get up from the toilet. Injury to right side of the face.  EXAM: CT HEAD WITHOUT CONTRAST  CT MAXILLOFACIAL WITHOUT CONTRAST  TECHNIQUE: Multidetector CT imaging of the head and maxillofacial structures were performed using the standard protocol without intravenous contrast. Multiplanar CT image reconstructions of the maxillofacial structures were also generated.  COMPARISON:  None.  FINDINGS: CT HEAD FINDINGS  The study is moderately degraded by patient motion. Moderate generalized atrophy is present. Periventricular white matter hypoattenuation is evident bilaterally. No acute cortical infarct, hemorrhage, or mass lesion. The ventricles are proportionate to the degree  of atrophy.  Fluid is present in the left sphenoid sinus. A posterior right ethmoid air cell is opacified. The remaining paranasal sinuses and the mastoid air cells are clear. The osseous skull is intact.  CT MAXILLOFACIAL FINDINGS  Extensive soft tissue swelling is noted along the right intraorbital soft tissue adjacent to the right maxillary sinus and right nasal bone without an underlying fracture. The nasal bones are intact. The nasal cavity is clear. The ostiomeatal complex is patent bilaterally with a with small ethmoid bulla on the left.  The patient is status post bilateral lens extractions. The globes and orbits are otherwise intact.  The posterior right ethmoid air cell is near completely opacified. Fluid is present in the left sphenoid sinus.  IMPRESSION: 1. No acute intracranial abnormality. 2. Moderate atrophy and diffuse white matter disease. 3. Posterior right ethmoid and left sphenoid sinus disease. 4. Extensive soft tissue swelling along the inferior aspect of the right orbit without an orbital injury. 5. Gas is present within the soft tissue swelling compatible with infection. 6. No focal osseous abnormality. 7. Posterior right ethmoid and left sphenoid sinus disease.   Electronically Signed   By: Gennette Pac M.D.   On: 08/03/2013 21:53    Microbiology: Recent Results (from the past 240 hour(s))  MRSA PCR SCREENING     Status: None   Collection Time    08/03/13 10:36 PM      Result Value Ref Range Status   MRSA by PCR NEGATIVE  NEGATIVE Final   Comment:            The GeneXpert MRSA Assay (FDA     approved for NASAL specimens     only), is one component of a     comprehensive MRSA colonization     surveillance program. It is not     intended to diagnose MRSA     infection nor to guide or     monitor treatment for     MRSA infections.     Labs: Basic Metabolic Panel:  Recent Labs Lab 08/03/13 1920 08/03/13 1925 08/04/13 0313  NA 140 139 138  K 4.6 4.2 4.0  CL 102  101 100  CO2 24  --  22  GLUCOSE 107* 109* 87  BUN 14 13 12   CREATININE 0.66 0.70 0.61  CALCIUM 9.7  --  9.4   Liver Function Tests:  Recent Labs Lab 08/03/13 1920  AST 17  ALT 8  ALKPHOS 59  BILITOT <0.2*  PROT 7.0  ALBUMIN 4.0   CBC:  Recent Labs Lab 08/03/13 1920 08/03/13 1925 08/04/13 0313  WBC 8.4  --  8.6  NEUTROABS 6.2  --   --   HGB 11.4* 13.3 11.7*  HCT 38.1 39.0 38.5  MCV 77.3*  --  76.8*  PLT 107*  --  101*   Cardiac Enzymes:  Recent Labs Lab 08/03/13 1920  TROPONINI <0.30    Signed:  Vassie Loll  [facility] 08/06/2013, 10:03 AM

## 2013-08-06 NOTE — Care Management Note (Signed)
    Page 1 of 1   08/06/2013     11:33:53 AM CARE MANAGEMENT NOTE 08/06/2013  Patient:  Ihor AustinBARBER,Jamese   Account Number:  [id number]  Date Initiated:  08/06/2013  Documentation initiated by:  Oletta CohnWOOD,CAMELLIA  Subjective/Objective Assessment:   80 y.o. female with history of hypothyroidism hyperlipidemia [facility]     Action/Plan:   CT head, opthalmology consult, revise leads/ pacer replacement.   Anticipated DC Date:  08/06/2013   Anticipated DC Plan:  SKILLED NURSING FACILITY  In-house referral  Clinical Social Worker      DC Planning Services  CM consult      [facility]   Choice offered to / List presented to:             Status of service:  Completed, signed off Medicare Important Message given?  YES (If response is "NO", the following Medicare IM given date fields will be blank) Date Medicare IM given:  08/06/2013 Date Additional Medicare IM given:    Discharge Disposition:  SKILLED NURSING FACILITY  Per UR Regulation:  Reviewed for med. necessity/level of care/duration of stay  If discussed at Long Length of Stay Meetings, dates discussed:    Comments:

## 2013-08-06 NOTE — Clinical Social Work Psychosocial (Addendum)
Clinical Social Work Department BRIEF PSYCHOSOCIAL ASSESSMENT 08/05/2013  Patient:  Morgan Christensen, Morgan Christensen     Account Number:  [id number]     Admit date:  08/03/2013  Clinical Social Worker:  Iona Coach  Date/Time:  08/05/2013 01:55 PM  Referred by:  Physician  Date Referred:  08/04/2013 Referred for  SNF Placement   Other Referral:   Return to SNF   Interview type:  Patient Other interview type:   Permission from patient to contact her neice as she helps to make all [facility] decisions    PSYCHOSOCIAL DATA Living Status:  FACILITY Admitted from facility:  [facility] Level of care:  Loyal Primary support name:  Berta Minor  [phone] Primary support relationship to patient:  FAMILY Degree of support available:   Neice  Strong support    CURRENT CONCERNS Current Concerns  Other - See comment   Other Concerns:   Return to SNF when stable    SOCIAL WORK ASSESSMENT / PLAN 80 year old female- resident of [place]. CSW met with patient this afternoon. She is alert, oriented and somewhat hard of hearing. Patient states that she likes staying at [facility] and wants to return there when medically stable. She defers as needed to her neice- Arnetta and gave ok for CSW to contact her neice as needed. CSW spoke to Estelle at [facility]- they will accept patient bakc when medically stable.  Fl2 placed on chart for MD's siganture.   Assessment/plan status:  Psychosocial Support/Ongoing Assessment of Needs Other assessment/ plan:   Information/referral to community resources:   None at this time.    PATIENT'S/FAMILY'S RESPONSE TO PLAN OF CARE: Very pleasant lady who is alert and oriented;  she welcomed CSW into her room and was very accepting of visit. She stated that she likes her room at [facility] and that they "take good care of me."  Her neice is very supportive and invovled in her care per patient she she trusts her to  help her make good health care decisions.  CSW will assist patient with return to SNF when medically stable per MD.

## 2013-08-08 ENCOUNTER — Non-Acute Institutional Stay (SKILLED_NURSING_FACILITY): Payer: 59 | Admitting: Internal Medicine

## 2013-08-08 DIAGNOSIS — E039 Hypothyroidism, unspecified: Secondary | ICD-10-CM

## 2013-08-08 DIAGNOSIS — I10 Essential (primary) hypertension: Secondary | ICD-10-CM | POA: Insufficient documentation

## 2013-08-08 DIAGNOSIS — I441 Atrioventricular block, second degree: Secondary | ICD-10-CM

## 2013-08-08 DIAGNOSIS — D649 Anemia, unspecified: Secondary | ICD-10-CM | POA: Insufficient documentation

## 2013-08-08 NOTE — Progress Notes (Signed)
HISTORY & PHYSICAL  DATE: 08/08/2013   FACILITY: [facility]  LEVEL OF CARE: SNF (31)  ALLERGIES:  Allergies  Allergen Reactions  . Aspirin Nausea And Vomiting  . Shellfish-Derived Products Nausea And Vomiting    CHIEF COMPLAINT:  Manage Morbitz type II AV block, hypothyroidism and hypertension  HISTORY OF PRESENT ILLNESS: 80 year old African American female was hospitalized after a fall. After hospitalization she is readmitted back to the facility for long-term care management.  Morbitz type II AVB: Patient's EKG showed Mobitz type II AV block. She underwent pacemaker implantation. She denies chest pain, shortness of breath, dizziness or lightheadedness.  HYPOTHYROIDISM: The hypothyroidism is unstable. No complications noted from the medications presently being used.  The patient denies fatigue or constipation.  Last TSH 8.15.  HTN: Pt 's HTN remains stable.  Denies CP, sob, DOE, pedal edema, headaches, dizziness or visual disturbances.  No complications from the medications currently being used.  Last BP : 92/58.  PAST MEDICAL HISTORY :  Past Medical History  Diagnosis Date  . Osteoporosis   . Hypothyroidism   . Hyperlipidemia   . Inflammatory arthropathy   . Hearing loss   . Gait abnormality   . Mobitz type 2 second degree atrioventricular block 08/03/2013  . Pacemaker- St Jude 08/06/2013    PAST SURGICAL HISTORY: None  SOCIAL HISTORY:  reports that she has never smoked. She does not have any smokeless tobacco history on file. She reports that she does not drink alcohol or use illicit drugs.  FAMILY HISTORY: None  CURRENT MEDICATIONS: Reviewed per MAR/see medication list  REVIEW OF SYSTEMS:  See HPI otherwise 14 point ROS is negative.  PHYSICAL EXAMINATION  VS:  See VS section  GENERAL: no acute distress, normal body habitus EYES: conjunctivae normal, sclerae normal, normal eye lids MOUTH/THROAT: lips without lesions,no lesions in  the mouth,tongue is without lesions,uvula elevates in midline NECK: supple, trachea midline, no neck masses, no thyroid tenderness, no thyromegaly LYMPHATICS: no LAN in the neck, no supraclavicular LAN RESPIRATORY: breathing is even & unlabored, BS CTAB CARDIAC: RRR, no murmur,no extra heart sounds, no edema GI:  ABDOMEN: abdomen soft, normal BS, no masses, no tenderness  LIVER/SPLEEN: no hepatomegaly, no splenomegaly MUSCULOSKELETAL: HEAD: normal to inspection  EXTREMITIES: LEFT UPPER EXTREMITY: full range of motion, normal strength & tone RIGHT UPPER EXTREMITY:  full range of motion, normal strength & tone LEFT LOWER EXTREMITY:  full range of motion, normal strength & tone RIGHT LOWER EXTREMITY:  full range of motion, normal strength & tone PSYCHIATRIC: the patient is alert & oriented to person, affect & behavior appropriate  LABS/RADIOLOGY:  Labs reviewed: Basic Metabolic Panel:  Recent Labs  16/11/9604/14/15 1920 08/03/13 1925 08/04/13 0313  NA 140 139 138  K 4.6 4.2 4.0  CL 102 101 100  CO2 24  --  22  GLUCOSE 107* 109* 87  BUN 14 13 12   CREATININE 0.66 0.70 0.61  CALCIUM 9.7  --  9.4   Liver Function Tests:  Recent Labs  08/03/13 1920  AST 17  ALT 8  ALKPHOS 59  BILITOT <0.2*  PROT 7.0  ALBUMIN 4.0   CBC:  Recent Labs  08/03/13 1920 08/03/13 1925 08/04/13 0313  WBC 8.4  --  8.6  NEUTROABS 6.2  --   --   HGB 11.4* 13.3 11.7*  HCT 38.1 39.0 38.5  MCV 77.3*  --  76.8*  PLT 107*  --  101*  Cardiac Enzymes:  Recent Labs  08/03/13 1920  TROPONINI <0.30    CT HEAD WITHOUT CONTRAST   CT MAXILLOFACIAL WITHOUT CONTRAST   TECHNIQUE: Multidetector CT imaging of the head and maxillofacial structures were performed using the standard protocol without intravenous contrast. Multiplanar CT image reconstructions of the maxillofacial structures were also generated.   COMPARISON:  None.   FINDINGS: CT HEAD FINDINGS   The study is moderately degraded by  patient motion. Moderate generalized atrophy is present. Periventricular white matter hypoattenuation is evident bilaterally. No acute cortical infarct, hemorrhage, or mass lesion. The ventricles are proportionate to the degree of atrophy.   Fluid is present in the left sphenoid sinus. A posterior right ethmoid air cell is opacified. The remaining paranasal sinuses and the mastoid air cells are clear. The osseous skull is intact.   CT MAXILLOFACIAL FINDINGS   Extensive soft tissue swelling is noted along the right intraorbital soft tissue adjacent to the right maxillary sinus and right nasal bone without an underlying fracture. The nasal bones are intact. The nasal cavity is clear. The ostiomeatal complex is patent bilaterally with a with small ethmoid bulla on the left.   The patient is status post bilateral lens extractions. The globes and orbits are otherwise intact.   The posterior right ethmoid air cell is near completely opacified. Fluid is present in the left sphenoid sinus.   IMPRESSION: 1. No acute intracranial abnormality. 2. Moderate atrophy and diffuse white matter disease. 3. Posterior right ethmoid and left sphenoid sinus disease. 4. Extensive soft tissue swelling along the inferior aspect of the right orbit without an orbital injury. 5. Gas is present within the soft tissue swelling compatible with infection. 6. No focal osseous abnormality. 7. Posterior right ethmoid and left sphenoid sinus disease.     CHEST  2 VIEW   COMPARISON:  None.   FINDINGS: Cardiomediastinal silhouette appears normal. No pneumothorax or pleural effusion is noted. Left-sided pacemaker is noted with leads in grossly good position. Bony thorax is intact. Minimal central pulmonary vascular congestion is noted.   IMPRESSION: Left-sided pacemaker is noted with leads in grossly good position. No pneumothorax is noted. Minimal central pulmonary vascular congestion is  noted.   ASSESSMENT/PLAN:  Mobitz type II AV block-status post pacemaker implantation. Hypothyroidism-unstable problem. Synthroid was adjusted. Recheck TSH in 6 weeks. Hypertension-stable Microcytic anemia-recheck hemoglobin level. Check iron studies. Hyperlipidemia-continue Zocor GERD-continue Protonix Constipation-continue MiraLax Check CBC  I have reviewed patient's medical records received at admission/from hospitalization.  CPT CODE: [id number]  Angela CoxGayani Y Kaelyn Nauta, MD [facility] [phone]

## 2013-08-12 ENCOUNTER — Encounter (HOSPITAL_COMMUNITY): Payer: Self-pay | Admitting: *Deleted

## 2013-08-13 ENCOUNTER — Ambulatory Visit: Payer: PRIVATE HEALTH INSURANCE

## 2013-08-15 ENCOUNTER — Non-Acute Institutional Stay (SKILLED_NURSING_FACILITY): Payer: PRIVATE HEALTH INSURANCE | Admitting: Internal Medicine

## 2013-08-15 DIAGNOSIS — D696 Thrombocytopenia, unspecified: Secondary | ICD-10-CM

## 2013-08-15 DIAGNOSIS — R718 Other abnormality of red blood cells: Secondary | ICD-10-CM

## 2013-08-20 ENCOUNTER — Ambulatory Visit (INDEPENDENT_AMBULATORY_CARE_PROVIDER_SITE_OTHER): Payer: Medicaid Other | Admitting: *Deleted

## 2013-08-20 DIAGNOSIS — I498 Other specified cardiac arrhythmias: Secondary | ICD-10-CM | POA: Diagnosis not present

## 2013-08-20 DIAGNOSIS — R001 Bradycardia, unspecified: Secondary | ICD-10-CM

## 2013-08-20 LAB — MDC_IDC_ENUM_SESS_TYPE_INCLINIC
Battery Impedance: 1000 Ohm — CL
Battery Voltage: 2.79 V
Brady Statistic RA Percent Paced: 40 %
Brady Statistic RV Percent Paced: 100 %
Date Time Interrogation Session: 20150701155947
Implantable Pulse Generator Model: 5820
Lead Channel Impedance Value: 505 Ohm
Lead Channel Impedance Value: 616 Ohm
Lead Channel Pacing Threshold Amplitude: 0.75 V
Lead Channel Pacing Threshold Pulse Width: 0.4 ms
Lead Channel Pacing Threshold Pulse Width: 0.4 ms
Lead Channel Sensing Intrinsic Amplitude: 2.5 mV
Lead Channel Setting Pacing Amplitude: 3.5 V
Lead Channel Setting Sensing Sensitivity: 2 mV
MDC IDC MSMT LEADCHNL RV PACING THRESHOLD AMPLITUDE: 0.75 V
MDC IDC MSMT LEADCHNL RV SENSING INTR AMPL: 6.6 mV
MDC IDC PG SERIAL: 7557230
MDC IDC SET LEADCHNL RV PACING PULSEWIDTH: 0.4 ms

## 2013-08-20 NOTE — Progress Notes (Signed)
Incision edges approximated, wound well healed. Normal device function. Thresholds, sensing, and impedances consistent with implant measurements. Device programmed at 3.5V/auto capture programmed on for extra safety margin until 3 month visit. Histogram distribution appropriate for patient and level of activity. 2 mode switches, <1 minute.  No high ventricular rates noted. Patient educated about wound care, arm mobility, lifting restrictions. ROV in 3 months with implanting physician.

## 2013-08-26 DIAGNOSIS — D696 Thrombocytopenia, unspecified: Secondary | ICD-10-CM | POA: Insufficient documentation

## 2013-08-26 DIAGNOSIS — R718 Other abnormality of red blood cells: Secondary | ICD-10-CM | POA: Insufficient documentation

## 2013-08-26 NOTE — Progress Notes (Signed)
Patient ID: Morgan Christensen, female   DOB: Jun 23, 1914, 80 y.o.   MRN: [id number]            PROGRESS NOTE  DATE: 08/15/2013        [facility]  LEVEL OF CARE: SNF (31)  Acute Visit  CHIEF COMPLAINT:  Manage thrombocytopenia.    HISTORY OF PRESENT ILLNESS: I was requested by the staff to assess the patient regarding above problem(s):  On 08/14/2013:  Patient's platelet count was 75.  In April 2015:   Platelet count was 111.  Patient denies easy bruising or bleeding.    PAST MEDICAL HISTORY : Reviewed.  No changes/see problem list  CURRENT MEDICATIONS: Reviewed per MAR/see medication list  REVIEW OF SYSTEMS:  GENERAL: no change in appetite, no fatigue, no weight changes, no fever, chills or weakness RESPIRATORY: no cough, SOB, DOE,, wheezing, hemoptysis CARDIAC: no chest pain, edema or palpitations GI: no abdominal pain, diarrhea, constipation, heart burn, nausea or vomiting  PHYSICAL EXAMINATION  VS:  T 96.8       P 60      RR 18      BP 130/66       WT (Lb) 127         GENERAL: no acute distress, normal body habitus NECK: supple, trachea midline, no neck masses, no thyroid tenderness, no thyromegaly RESPIRATORY: breathing is even & unlabored, BS CTAB CARDIAC: RRR, no murmur,no extra heart sounds, no edema GI: abdomen soft, normal BS, no masses, no tenderness, no hepatomegaly, no splenomegaly PSYCHIATRIC: the patient is alert & oriented to person, affect & behavior appropriate  LABS/RADIOLOGY:   On 08/14/2013:    MCV 76.    Ferritin level 269.  Iron level normal.    ASSESSMENT/PLAN:  Thrombocytopenia.  Unstable problem.  Platelets further declined.  We will monitor at this point.  We will recheck in a week.      Microcytosis.  No signs of iron deficiency.    CPT CODE: 4098199308          Angela CoxGayani Y Dasanayaka, MD [facility] [phone]

## 2013-08-28 ENCOUNTER — Non-Acute Institutional Stay (SKILLED_NURSING_FACILITY): Payer: PRIVATE HEALTH INSURANCE | Admitting: Internal Medicine

## 2013-08-28 DIAGNOSIS — E039 Hypothyroidism, unspecified: Secondary | ICD-10-CM

## 2013-08-28 DIAGNOSIS — K5900 Constipation, unspecified: Secondary | ICD-10-CM

## 2013-08-28 DIAGNOSIS — K219 Gastro-esophageal reflux disease without esophagitis: Secondary | ICD-10-CM

## 2013-08-28 DIAGNOSIS — E785 Hyperlipidemia, unspecified: Secondary | ICD-10-CM

## 2013-08-28 NOTE — Progress Notes (Signed)
         PROGRESS NOTE  DATE: 08-28-13  FACILITY: Nursing Home [facility]  LEVEL OF CARE: SNF (31)  Routine Visit  CHIEF COMPLAINT:  Manage hypothyroidism, hyperlipidemia and constipation  HISTORY OF PRESENT ILLNESS:  REASSESSMENT OF ONGOING PROBLEM(S):  HYPERLIPIDEMIA: No complications from the medications presently being used. In 4-15 HDL 29, LDL 101 otherwise fasting lipid panel normal.  CONSTIPATION: The constipation remains stable. No complications from the medications presently being used. Patient denies ongoing constipation, abdominal pain, nausea or vomiting.  HYPOTHYROIDISM: The hypothyroidism remains stable. No complications noted from the medications presently being used.  The patient denies fatigue or constipation.  Last TSH 6.43 in 4-15, in  6-15 TSH 8.15.  PAST MEDICAL HISTORY : Reviewed.  No changes/see problem list  CURRENT MEDICATIONS: Reviewed per MAR/see medication list  REVIEW OF SYSTEMS:  GENERAL: no change in appetite, no fatigue, no weight changes, no fever, chills or weakness RESPIRATORY: no cough, SOB, DOE, wheezing, hemoptysis CARDIAC: no chest pain, edema or palpitations GI: no abdominal pain, diarrhea, constipation, heart burn, nausea or vomiting  PHYSICAL EXAMINATION  VS:  See VS section  GENERAL: no acute distress, normal body habitus NECK: supple, trachea midline, no neck masses, no thyroid tenderness, no thyromegaly RESPIRATORY: breathing is even & unlabored, BS CTAB CARDIAC: RRR, no murmur,no extra heart sounds, no edema GI: abdomen soft, normal BS, no masses, no tenderness, no hepatomegaly, no splenomegaly PSYCHIATRIC: the patient is alert & oriented to person, affect & behavior appropriate  LABS/RADIOLOGY: 7-15 MCV 76 otherwise CBC normal 5-15 date in 2 or 9, TIBC 322 ,serum iron 92, percent saturation 29 4-15 MCV 73, platelets 111, hemoglobin 11.9, WBC 8.7, glucose 102 otherwise CMP  normal  ASSESSMENT/PLAN:  Hypothyroidism-levothyroxine was increased. Recheck TSH in 6 weeks pending. Hyperlipidemia-well controlled Constipation-well controlled GERD-stable Allergic rhinitis-well controlled Depression-continue Zoloft Microcytosis-no iron deficiency  CPT CODE: 1610999308  Morgan CoxGayani Y Joniyah Mallinger, MD [facility] [phone]

## 2013-09-03 ENCOUNTER — Encounter: Payer: Self-pay | Admitting: Cardiology

## 2013-09-09 ENCOUNTER — Encounter: Payer: Self-pay | Admitting: Internal Medicine

## 2013-09-22 ENCOUNTER — Non-Acute Institutional Stay (SKILLED_NURSING_FACILITY): Payer: PRIVATE HEALTH INSURANCE | Admitting: Internal Medicine

## 2013-09-22 DIAGNOSIS — E039 Hypothyroidism, unspecified: Secondary | ICD-10-CM

## 2013-09-22 DIAGNOSIS — K219 Gastro-esophageal reflux disease without esophagitis: Secondary | ICD-10-CM

## 2013-09-22 DIAGNOSIS — E785 Hyperlipidemia, unspecified: Secondary | ICD-10-CM

## 2013-09-22 DIAGNOSIS — K5900 Constipation, unspecified: Secondary | ICD-10-CM

## 2013-09-23 NOTE — Progress Notes (Signed)
         PROGRESS NOTE  DATE: 09-22-13  FACILITY: Nursing Home [facility]  LEVEL OF CARE: SNF (31)  Routine Visit  CHIEF COMPLAINT:  Manage hypothyroidism, hyperlipidemia and constipation  HISTORY OF PRESENT ILLNESS:  REASSESSMENT OF ONGOING PROBLEM(S):  HYPERLIPIDEMIA: No complications from the medications presently being used. In 4-15 HDL 29, LDL 101 otherwise fasting lipid panel normal.  CONSTIPATION: The constipation remains stable. No complications from the medications presently being used. Patient denies ongoing constipation, abdominal pain, nausea or vomiting.  HYPOTHYROIDISM: The hypothyroidism remains stable. No complications noted from the medications presently being used.  The patient denies fatigue or constipation.  Last TSH 6.43 in 4-15, in  6-15 TSH 8.15, in 7-15 TSH 4.72.Marland Kitchen.  PAST MEDICAL HISTORY : Reviewed.  No changes/see problem list  CURRENT MEDICATIONS: Reviewed per MAR/see medication list  REVIEW OF SYSTEMS:  GENERAL: no change in appetite, no fatigue, no weight changes, no fever, chills or weakness RESPIRATORY: no cough, SOB, DOE, wheezing, hemoptysis CARDIAC: no chest pain, edema or palpitations GI: no abdominal pain, diarrhea, constipation, heart burn, nausea or vomiting  PHYSICAL EXAMINATION  VS:  See VS section  GENERAL: no acute distress, normal body habitus EYES: Normal sclerae, normal conjunctivae, no discharge NECK: supple, trachea midline, no neck masses, no thyroid tenderness, no thyromegaly LYMPHATICS: No cervical lymphadenopathy, no supraclavicular lymphadenopathy RESPIRATORY: breathing is even & unlabored, BS CTAB CARDIAC: RRR, no murmur,no extra heart sounds, no edema GI: abdomen soft, normal BS, no masses, no tenderness, no hepatomegaly, no splenomegaly PSYCHIATRIC: the patient is alert & oriented to person, affect & behavior appropriate  LABS/RADIOLOGY: 7-15 MCV 76 otherwise CBC normal 5-15 date in 2  or 9, TIBC 322 ,serum iron 92, percent saturation 29 4-15 MCV 73, platelets 111, hemoglobin 11.9, WBC 8.7, glucose 102 otherwise CMP normal  ASSESSMENT/PLAN:  Hypothyroidism-well controlled. Hyperlipidemia-well controlled Constipation-well controlled GERD-stable Allergic rhinitis-well controlled Depression-continue Zoloft Microcytosis-no iron deficiency Elevated blood pressure-we'll review a log.  CPT CODE: [id number]  Morgan CoxGayani Y Sontee Desena, MD [facility] [phone]

## 2013-11-05 ENCOUNTER — Non-Acute Institutional Stay (SKILLED_NURSING_FACILITY): Payer: PRIVATE HEALTH INSURANCE | Admitting: Internal Medicine

## 2013-11-05 DIAGNOSIS — I10 Essential (primary) hypertension: Secondary | ICD-10-CM

## 2013-11-05 DIAGNOSIS — E785 Hyperlipidemia, unspecified: Secondary | ICD-10-CM

## 2013-11-05 DIAGNOSIS — K5900 Constipation, unspecified: Secondary | ICD-10-CM

## 2013-11-05 DIAGNOSIS — E039 Hypothyroidism, unspecified: Secondary | ICD-10-CM

## 2013-11-06 NOTE — Progress Notes (Signed)
         PROGRESS NOTE  DATE: 11-05-13  FACILITY: Nursing Home Location: [facility]  LEVEL OF CARE: SNF (31)  Routine Visit  CHIEF COMPLAINT:  Manage hypothyroidism, hyperlipidemia and constipation  HISTORY OF PRESENT ILLNESS:  REASSESSMENT OF ONGOING PROBLEM(S):  HYPERLIPIDEMIA: No complications from the medications presently being used. In 4-15 HDL 29, LDL 101 otherwise fasting lipid panel normal.  CONSTIPATION: The constipation remains stable. No complications from the medications presently being used. Patient denies ongoing constipation, abdominal pain, nausea or vomiting.  HYPOTHYROIDISM: The hypothyroidism remains stable. No complications noted from the medications presently being used.  The patient denies fatigue or constipation.  Last TSH 6.43 in 4-15, in  6-15 TSH 8.15, in 7-15 TSH 4.72.Marland Kitchen  PAST MEDICAL HISTORY : Reviewed.  No changes/see problem list  CURRENT MEDICATIONS: Reviewed per MAR/see medication list  REVIEW OF SYSTEMS:  GENERAL: no change in appetite, no fatigue, no weight changes, no fever, chills or weakness RESPIRATORY: no cough, SOB, DOE, wheezing, hemoptysis CARDIAC: no chest pain, edema or palpitations GI: no abdominal pain, diarrhea, constipation, heart burn, nausea or vomiting  PHYSICAL EXAMINATION  VS:  See VS section  GENERAL: no acute distress, normal body habitus EYES: Normal sclerae, normal conjunctivae, no discharge NECK: supple, trachea midline, no neck masses, no thyroid tenderness, no thyromegaly LYMPHATICS: No cervical lymphadenopathy, no supraclavicular lymphadenopathy RESPIRATORY: breathing is even & unlabored, BS CTAB CARDIAC: RRR, no murmur,no extra heart sounds, no edema GI: abdomen soft, normal BS, no masses, no tenderness, no hepatomegaly, no splenomegaly PSYCHIATRIC: the patient is alert & oriented to person, affect & behavior appropriate  LABS/RADIOLOGY: 7-15 MCV 76 otherwise CBC normal 5-15 date in  2 or 9, TIBC 322 ,serum iron 92, percent saturation 29 4-15 MCV 73, platelets 111, hemoglobin 11.9, WBC 8.7, glucose 102 otherwise CMP normal  ASSESSMENT/PLAN:  Hypothyroidism-well controlled. Hyperlipidemia-well controlled Constipation-well controlled Hypertension-new problem. Start HCTZ 12.5 mg daily. GERD-stable Allergic rhinitis-well controlled Depression-continue Zoloft Microcytosis-no iron deficiency Check BMP on 11-10-13  CPT CODE: 32440  Angela Cox, MD [facility] [phone]

## 2013-11-11 ENCOUNTER — Other Ambulatory Visit: Payer: Self-pay | Admitting: Internal Medicine

## 2013-11-11 ENCOUNTER — Encounter: Payer: Self-pay | Admitting: Internal Medicine

## 2013-11-11 ENCOUNTER — Encounter: Payer: PRIVATE HEALTH INSURANCE | Admitting: Internal Medicine

## 2013-11-11 ENCOUNTER — Ambulatory Visit (INDEPENDENT_AMBULATORY_CARE_PROVIDER_SITE_OTHER): Payer: Medicare (Managed Care) | Admitting: Internal Medicine

## 2013-11-11 VITALS — BP 115/62 | HR 69 | Ht 63.0 in | Wt 138.0 lb

## 2013-11-11 DIAGNOSIS — I498 Other specified cardiac arrhythmias: Secondary | ICD-10-CM

## 2013-11-11 DIAGNOSIS — R001 Bradycardia, unspecified: Secondary | ICD-10-CM

## 2013-11-11 LAB — MDC_IDC_ENUM_SESS_TYPE_INCLINIC
Battery Voltage: 2.78 V
Lead Channel Pacing Threshold Amplitude: 0.5 V
Lead Channel Pacing Threshold Pulse Width: 0.4 ms
Lead Channel Setting Pacing Amplitude: 2 V
Lead Channel Setting Sensing Sensitivity: 2 mV
MDC IDC MSMT BATTERY IMPEDANCE: 1000 Ohm — AB
MDC IDC MSMT LEADCHNL RA IMPEDANCE VALUE: 555 Ohm
MDC IDC MSMT LEADCHNL RA PACING THRESHOLD AMPLITUDE: 0.5 V
MDC IDC MSMT LEADCHNL RA PACING THRESHOLD PULSEWIDTH: 0.4 ms
MDC IDC MSMT LEADCHNL RA SENSING INTR AMPL: 2.1 mV
MDC IDC MSMT LEADCHNL RV IMPEDANCE VALUE: 671 Ohm
MDC IDC MSMT LEADCHNL RV SENSING INTR AMPL: 8.3 mV
MDC IDC PG SERIAL: 7557230
MDC IDC SESS DTM: 20150922164436
MDC IDC SET LEADCHNL RV PACING PULSEWIDTH: 0.4 ms
MDC IDC STAT BRADY RA PERCENT PACED: 25 %
MDC IDC STAT BRADY RV PERCENT PACED: 97 %

## 2013-11-11 NOTE — Patient Instructions (Signed)
Your physician recommends that you continue on your current medications as directed. Please refer to the Current Medication list given to you today.   Your physician wants you to follow-up in: 9 months with Dr. Logan Bores will receive a reminder letter in the mail two months in advance. If you don't receive a letter, please call our office to schedule the follow-up appointment.  Thank you for visiting with [facility] today!!

## 2013-11-11 NOTE — Progress Notes (Signed)
      Patient has no care team.   HPI  Morgan Christensen is a 80 y.o. female Seen in followup for a pacemaker implanted 6/15 for syncope in the context of Mobitz 2 heart block and sinus bradycardia.  She is much improved with pacing without recurrent syncope   Past Medical History  Diagnosis Date  . Osteoporosis   . Hypothyroidism   . Hyperlipidemia   . Inflammatory arthropathy   . Hearing loss   . Gait abnormality   . Mobitz type 2 second degree atrioventricular block 08/03/2013    Past Surgical History  Procedure Laterality Date  . Pacemaker insertion  08-05-2013    STJ dual chamber pacemaker implanted by Dr Graciela Husbands for intermittent high grade heart block    Current Outpatient Prescriptions  Medication Sig Dispense Refill  . calcium-vitamin D (OSCAL WITH D) 500-200 MG-UNIT per tablet Take 1 tablet by mouth daily.       Marland Kitchen docusate calcium (SURFAK) 240 MG capsule Take 240 mg by mouth at bedtime.       Marland Kitchen HYDROcodone-acetaminophen (NORCO/VICODIN) 5-325 MG per tablet Take 1 tablet by mouth every 8 (eight) hours as needed for moderate pain.  90 tablet  0  . levothyroxine (SYNTHROID, LEVOTHROID) 75 MCG tablet Take 75 mcg by mouth daily before breakfast.      . mometasone (NASONEX) 50 MCG/ACT nasal spray Place 2 sprays into the nose daily.      . pantoprazole (PROTONIX) 40 MG tablet Take 40 mg by mouth daily before breakfast.       . polyethylene glycol (MIRALAX / GLYCOLAX) packet Take 17 g by mouth daily. Mix in 4-8 oz of liquid and drink      . sertraline (ZOLOFT) 25 MG tablet Take 25 mg by mouth daily.      . simvastatin (ZOCOR) 20 MG tablet Take 20 mg by mouth at bedtime.       . traMADol (ULTRAM) 50 MG tablet Take one tablet by mouth every 6 hours as needed for moderate pain  120 tablet  5   No current facility-administered medications for this visit.    Allergies  Allergen Reactions  . Aspirin Nausea And Vomiting  . Shellfish-Derived Products Nausea And Vomiting     Review of Systems negative except from HPI and PMH  Physical Exam BP 115/62  Pulse 69  Ht  (1.6 m)  Wt 138 lb (62.596 kg)  BMI 24.45 kg/m2 Well developed and nourished in no acute distress HENT normal Neck supple with JVP-flat Clear Device pocket well healed; without hematoma or erythema.  There is no tethering Regular rate and rhythm, no murmurs or gallops Abd-soft with active BS No Clubbing cyanosis edema Skin-warm and dry A & Oriented  Grossly normal sensory and motor function   ECG P-synchronous/ AV  pacing  Assessment and  Plan  High Grade heart block  Pacemaker St Jude  The patient's device was interrogated and the information was fully reviewed.  The device was reprogrammed to  Maximize longevity

## 2013-11-12 ENCOUNTER — Encounter: Payer: Self-pay | Admitting: Internal Medicine

## 2013-11-26 ENCOUNTER — Non-Acute Institutional Stay (SKILLED_NURSING_FACILITY): Payer: PRIVATE HEALTH INSURANCE | Admitting: Internal Medicine

## 2013-11-26 DIAGNOSIS — I10 Essential (primary) hypertension: Secondary | ICD-10-CM

## 2013-12-03 ENCOUNTER — Non-Acute Institutional Stay (SKILLED_NURSING_FACILITY): Payer: PRIVATE HEALTH INSURANCE | Admitting: Internal Medicine

## 2013-12-03 DIAGNOSIS — K219 Gastro-esophageal reflux disease without esophagitis: Secondary | ICD-10-CM

## 2013-12-03 DIAGNOSIS — K5900 Constipation, unspecified: Secondary | ICD-10-CM

## 2013-12-03 DIAGNOSIS — E039 Hypothyroidism, unspecified: Secondary | ICD-10-CM

## 2013-12-03 DIAGNOSIS — E785 Hyperlipidemia, unspecified: Secondary | ICD-10-CM

## 2013-12-03 NOTE — Progress Notes (Signed)
Patient ID: Morgan Christensen, female   DOB: Jul 06, 1914, 80 y.o.   MRN: [id number]           PROGRESS NOTE  DATE: 11/26/2013          [facility]  LEVEL OF CARE: SNF (31)  Acute Visit  CHIEF COMPLAINT:  Manage hypertension.    HISTORY OF PRESENT ILLNESS: I was requested by the staff to assess the patient regarding above problem(s):  HTN:  Pt 's HTN remains stable.  Denies CP, sob, DOE, pedal edema, headaches, dizziness or visual disturbances.  No complications from the medications currently being used.  Last BP :   96/60.   I was requested by patient's health insurance to assess the patient for possible use of an ACE inhibitor for CHF.  Patient does not have a history of CHF in her medical record.       PAST MEDICAL HISTORY : Reviewed.  No changes/see problem list  CURRENT MEDICATIONS: Reviewed per MAR/see medication list  REVIEW OF SYSTEMS:  GENERAL: no change in appetite, no fatigue, no weight changes, no fever, chills or weakness RESPIRATORY: no cough, SOB, DOE,, wheezing, hemoptysis CARDIAC: no chest pain, edema or palpitations GI: no abdominal pain, diarrhea, constipation, heart burn, nausea or vomiting  PHYSICAL EXAMINATION  VS: see VS section  GENERAL: no acute distress, normal body habitus NECK: supple, trachea midline, no neck masses, no thyroid tenderness, no thyromegaly RESPIRATORY: breathing is even & unlabored, BS CTAB CARDIAC: RRR, no murmur,no extra heart sounds, no edema GI: abdomen soft, normal BS, no masses, no tenderness, no hepatomegaly, no splenomegaly PSYCHIATRIC: the patient is alert & oriented to person, affect & behavior appropriate  ASSESSMENT/PLAN:  Hypertension.  Well controlled.  Blood pressure actually on the low-normal side.  She does not have a history of CHF.  At this point, an ACE inhibitor is not indicated.    CPT CODE: [id number], [id number]               Angela CoxGayani Y Dasanayaka, MD [facility] [phone]

## 2013-12-04 NOTE — Progress Notes (Signed)
         PROGRESS NOTE  DATE: 12-03-13  FACILITY: Nursing Home Location: [facility]  LEVEL OF CARE: SNF (31)  Routine Visit  CHIEF COMPLAINT:  Manage hypothyroidism, hyperlipidemia and constipation  HISTORY OF PRESENT ILLNESS:  REASSESSMENT OF ONGOING PROBLEM(S):  HYPERLIPIDEMIA: No complications from the medications presently being used. In 4-15 HDL 29, LDL 101 otherwise fasting lipid panel normal, in 10-15 triglycerides 162, HDL 30, total cholesterol 161153, LDL 91  CONSTIPATION: The constipation remains stable. No complications from the medications presently being used. Patient denies ongoing constipation, abdominal pain, nausea or vomiting.  HYPOTHYROIDISM: The hypothyroidism remains stable. No complications noted from the medications presently being used.  The patient denies fatigue or constipation.  Last TSH 6.43 in 4-15, in  6-15 TSH 8.15, in 7-15 TSH 4.72.Marland Kitchen.  PAST MEDICAL HISTORY : Reviewed.  No changes/see problem list  CURRENT MEDICATIONS: Reviewed per MAR/see medication list  REVIEW OF SYSTEMS:  GENERAL: no change in appetite, no fatigue, no weight changes, no fever, chills or weakness RESPIRATORY: no cough, SOB, DOE, wheezing, hemoptysis CARDIAC: no chest pain, edema or palpitations GI: no abdominal pain, diarrhea, constipation, heart burn, nausea or vomiting  PHYSICAL EXAMINATION  VS:  See VS section  GENERAL: no acute distress, normal body habitus EYES: Normal sclerae, normal conjunctivae, no discharge NECK: supple, trachea midline, no neck masses, no thyroid tenderness, no thyromegaly LYMPHATICS: No cervical lymphadenopathy, no supraclavicular lymphadenopathy RESPIRATORY: breathing is even & unlabored, BS CTAB CARDIAC: RRR, no murmur,no extra heart sounds, no edema GI: abdomen soft, normal BS, no masses, no tenderness, no hepatomegaly, no splenomegaly PSYCHIATRIC: the patient is alert & oriented to person, affect & behavior  appropriate  LABS/RADIOLOGY: 10-15 MCV 74 otherwise CBC normal, glucose 100 otherwise CMP normal, hemoglobin A1c 6.1 9-15 glucose 109 otherwise BMP normal 7-15 MCV 76 otherwise CBC normal 5-15 date in 2 or 9, TIBC 322 ,serum iron 92, percent saturation 29 4-15 MCV 73, platelets 111, hemoglobin 11.9, WBC 8.7, glucose 102 otherwise CMP normal  ASSESSMENT/PLAN:  Hypothyroidism-well controlled. Hyperlipidemia-well controlled Constipation-well controlled GERD-stable Allergic rhinitis-well controlled Depression-continue Zoloft Microcytosis-no iron deficiency Hypertension-HCTZ was started  CPT CODE: [id number]  Angela CoxGayani Y Dasanayaka, MD [facility] [phone]

## 2014-01-29 ENCOUNTER — Encounter (HOSPITAL_COMMUNITY): Payer: Self-pay | Admitting: Internal Medicine

## 2014-03-05 ENCOUNTER — Encounter (HOSPITAL_COMMUNITY): Payer: Self-pay | Admitting: Internal Medicine

## 2014-08-12 ENCOUNTER — Encounter: Payer: Self-pay | Admitting: Podiatry

## 2014-08-12 ENCOUNTER — Ambulatory Visit (INDEPENDENT_AMBULATORY_CARE_PROVIDER_SITE_OTHER): Payer: Medicare (Managed Care) | Admitting: Podiatry

## 2014-08-12 VITALS — BP 107/54 | HR 61 | Temp 98.4°F | Resp 12

## 2014-08-12 DIAGNOSIS — Q828 Other specified congenital malformations of skin: Secondary | ICD-10-CM

## 2014-08-12 DIAGNOSIS — L02612 Cutaneous abscess of left foot: Secondary | ICD-10-CM

## 2014-08-12 NOTE — Patient Instructions (Addendum)
Wrote on report of consultation Cephalexin 500 mg by mouth twice a day 7 days Diial anti bacterial soft soap soaks, third left toe daily with application of Triple Antibiotic ointment daily to the third left toe until healed  Return if symptoms have not improved after completing anabiotic

## 2014-08-12 NOTE — Progress Notes (Signed)
   Subjective:    Patient ID: Morgan Christensen, female    DOB: 1914-11-21, 81 y.o.   MRN: [id number]  HPI N- sharpe L- left ball of foot/under 3rd toe,right under great toe  B/L toenail trim D-6 mos. O-intermittent, especially at night C-none A-walking T-foot cream but does not help at all Patient presents here today for a B/ L toenail trim and left ball of foot pain Patient's niece describes history of right foot pain, however patient seems to be more concerned with left foot pain  Patient's niece Morgan Christensen is present in treatment room and is her power of attorney  Review of Systems  HENT: Positive for hearing loss.   Musculoskeletal: Positive for back pain and gait problem.  Allergic/Immunologic: Positive for food allergies.       Objective:   Physical Exam  Patient has difficulty responding to questioning and her niece is assisting her  Vascular: DP and PT pulses 0/4 bilaterally Capillary reflex immediate bilaterally No peripheral edema noted bilaterally  Neurological: Trace reactive ankle reflexes bilaterally  Dermatological: Nucleated plantar keratoses sub-first MPJ right Surgical scar right first MPJ Distal third left toe has well-organized corn after debridement released a small amount of purulent drainage with low-grade erythema and edema around the distal third left toe Elongated dystrophic toenails 6-10    Musculoskeletal: HAV left Hammertoe second right     Assessment & Plan:   Assessment: Decrease pedal pulses suggestive peripheral arterial disease Porokeratosis 1 Abscess cellulitis third toe left foot  Plan: Review the results of examination with patient and niece today  I&D abscess third left toe releasing perulent drainage without odor and applied protective anabiotic dressing Continue to apply topical anabiotic ointment to the distal third right toe after Dial anti bacterial soft soap rinses until healed Rx cephalexin 500 mg by mouth  twice a day 7 days This prescription was written on report of consultation and sent back the patient's assisted living home  Debrided porokeratosis 1 Debride nails 10 without a bleeding  Reappoint at patient's request

## 2014-10-10 DIAGNOSIS — I70229 Atherosclerosis of native arteries of extremities with rest pain, unspecified extremity: Secondary | ICD-10-CM

## 2014-10-10 DIAGNOSIS — I998 Other disorder of circulatory system: Secondary | ICD-10-CM

## 2014-10-10 NOTE — H&P (Signed)
OFFICE VISIT NOTES COPIED TO EPIC FOR DOCUMENTATION  Morgan Christensen 31-Oct-2014 8:19 AM Location: [facility] Patient #: [phone] DOB: [birth year] Widowed / Language: Morgan Christensen / Race: Black or African American Female  History of Present Illness Morgan Page MD; 2014/10/31 4:34 PM) Patient words: f/u per Morgan Christensen; Pt denies any new symptoms since her last o/v. Pts caregiver is requesting a handicapped placard to have for when she transports her to and from the doctors appointment.  The patient is a 81 year old female who presents with peripheral vascular disease. She has history of paroxysmal atrial fibrillation, sick sinus syndrome and has pacemaker implantation. No recurrence of atrial fibrillation since November 2015, due to her high risk for cardioembolic phenomena, presently on long-term". She had developed left to ulceration, for which she underwent lower extremities arterial duplex and presents for follow-up.  She is presently doing well, however has developed ulceration in her toes that started about 3 months ago and also near the lateral malleolus bilaterally after she states she got injured on the wheelchair. She lives in a nursing home but it is able to function in the form of walking with a walker. Her cousin is present at bedside, (POA) Morgan Christensen. No other specific complaints, overall doing well. She is charming and witty 81 year old female patient with excellent memory.  Problem List/Past Medical Morgan Christensen; 10-31-2014 11:32 AM) Sick sinus syndrome (I49.5) PAD (peripheral artery disease) (I73.9) Hearing loss (H91.90) Hypothyroidism (E03.9) Osteoporosis (M81.0) Guillain-Barre (G61.0) Hyperlipidemia (E78.5) Abnormal gait (R26.9) Polyarthritis (M13.0) Cardiac pacemaker in situ (Z95.0)08/05/2013 St Jude pulse generator(Has no remote transmission capabality). Serial number [id number]: Dr. Olin Christensen. on 08/05/2013. Paroxysmal atrial fibrillation  (I48.0)01/08/2014 Pacemaker interogation 01/08/2014: One episode of 3.5 hour A. Fib with no recurrence. CHA2DS2-VASc Score is 3 with yearly risk of stroke of 3.2 %. Bleeding risk is 1%/year. Rec: [place]  Allergies Morgan Christensen; 2014/10/31 56:31 AM) Aspirin (Salicylates) Nausea. Shellfish  Family History Morgan Christensen; 10/31/14 11:32 AM) Mother Deceased. at age 46 from a Stroke Father Deceased. unknown Brother 1 Deceased. Step-Brother  Social History Morgan Christensen; October 31, 2014 11:32 AM) Current tobacco use Former smoker. quit in 1995 Non Drinker/No Alcohol Use Marital status Widowed. Living Situation [facility] at [facility] and [place] Number of Children 0.  Past Surgical History Morgan Christensen; 10/31/2014 11:32 AM) Appendectomy While she was in Morgan Christensen Tonsillectomy In her 88's Cardiac Pacemaker Insertion06/16/2015 St Jude pulse generator. Serial number [id number]: Dr. Olin Christensen.  Medication History Morgan Christensen; 10-31-14 11:41 AM) Eliquis (2.5MG  Tablet, 1 (one) Tablet Oral two times daily, Taken starting 08/19/2014) Active. Metoprolol Succinate ER (50MG  Tablet ER 24HR, 1 (one) Tablet ER 24HR Oral daily, Taken starting 01/08/2014) Active. Calcium-Vitamin D (500MG  Capsule, 1 Oral daily) Active. Nasonex (50MCG/ACT Suspension, 2 sprays Nasal daily) Active. Pantoprazole Sodium (40MG  Tablet DR, 1 Oral daily) Active. MiraLax (Mix 17GM in 8 OZ of liquid Oral daily) Active. Sertraline HCl (25MG  Tablet, 1 Oral daily) Active. Docusate Calcium (240MG  Capsule, 1 Oral at bedtime) Active. Simvastatin (10MG  Tablet, 1 Oral at bedtime) Active. TraMADol HCl (50MG  Tablet, 1 Oral evey 6 hours as needed for pain) Active. Levothyroxine Sodium (75MCG Tablet, 1 Oral daily) Active. Hydrochlorothiazide (12.5MG  Tablet, 1 Oral daily) Active. Vitamin D3 (2000UNIT Capsule, 1 Oral daily) Active. Capzasin-HP (0.1% Cream, apply to affected area External two  times daily) Active. Tylenol (325MG  Tablet, 2 Oral daily) Active. Gabapentin (100MG  Capsule, 1 Oral three times daily) Active. Medications Reconciled  Diagnostic Studies History (  Morgan Christensen; 10/01/2014 11:38 AM) Lower Extremity Dopplers07/28/2016 Severely dampened waveforms bilateral suggest bilateral inflow disease (iliac stenosis). This exam reveals severely decreased perfusion of both the lower extremities, noted at the post tibial artery level with RABI 0.45 and LABI 0..30, noted at the post tibial artery level. Consider further vascular work-up. Colonoscopy2005 Normal.   Review of Systems Morgan Page, MD; 10/01/2014 4:33 PM) General Present- Feeling well. Not Present- Fatigue, Fever and Night Sweats. Skin Not Present- Itching and Rash. HEENT Not Present- Headache. Respiratory Not Present- Difficulty Breathing. Cardiovascular Not Present- Claudications, Fainting, Orthopnea and Swelling of Extremities. Gastrointestinal Not Present- Abdominal Pain, Constipation, Diarrhea, Nausea and Vomiting. Musculoskeletal Not Present- Joint Swelling. Neurological Not Present- Headaches. Hematology Not Present- Blood Clots, Easy Bruising and Nose Bleed. Vitals Morgan Christensen; 10/01/2014 11:43 AM) 10/01/2014 11:32 AM Weight: 144.38 lb Height: 63in Body Surface Area: 1.68 m Body Mass Index: 25.57 kg/m  Pulse: 59 (Regular)  P.OX: 99% (Room air) BP: 100/54 (Sitting, Left Arm, Standard)     Physical Exam Morgan Page MD; 10/01/2014 12:18 PM) General Mental Status-Alert. General Appearance-Cooperative, Appears younger than stated age, Not in acute distress. Orientation-Oriented X3. Build & Nutrition-Well nourished and Moderately built.  Head and Neck Thyroid Gland Characteristics - no palpable nodules, no palpable enlargement.  Chest and Lung Exam Palpation Tender - No chest wall tenderness. Auscultation Breath sounds -  Clear.  Cardiovascular Inspection Jugular vein - Right - No Distention. Auscultation Heart Sounds - S1 WNL, S2 WNL and No gallop present. Murmurs & Other Heart Sounds - Murmur - No murmur.  Abdomen Inspection Contour - Obese. Palpation/Percussion Palpation and Percussion of the abdomen reveal - Non Tender and No hepatosplenomegaly. Auscultation Auscultation of the abdomen reveals - Bowel sounds normal.  Peripheral Vascular Lower Extremity Inspection - Left - Digital infarcts(left third toe), No Pigmentation, No Varicose veins. Right - No Pigmentation, No Varicose veins. Bilateral - Loss of hair. Palpation - Edema - Left - No edema. Right - No edema. Femoral pulse - Bilateral - Feeble. Popliteal pulse - Bilateral - Absent. Dorsalis pedis pulse - Bilateral - Absent. Posterior tibial pulse - Bilateral - Absent. Carotid arteries - Left-No Carotid bruit. Carotid arteries - Right-No Carotid bruit. Abdomen-No prominent abdominal aortic pulsation, No epigastric bruit.  Neurologic Motor-Grossly intact without any focal deficits.  Musculoskeletal - Did not examine.  Assessment & Plan (Devonna Shumate; 10/01/2014 12:45 PM) Critical lower limb ischemia (I99.8) Future Plans 08/10/3084: METABOLIC PANEL, BASIC (57846) - one time 10/08/2014: CBC & PLATELETS (AUTO) (96295) - one time 10/08/2014: PT (PROTHROMBIN TIME) (28413) - one time PAD (peripheral artery disease) (I73.9) Story: Lower extremity arterial duplex 09/17/2014: Severely dampened waveforms bilateral suggest bilateral inflow disease (iliac stenosis). This exam reveals severely decreased perfusion of both the lower extremities, noted at the post tibial artery level with RABI 0.45 and LABI 0..30, noted at the post tibial artery level. Consider further vascular work-up. Paroxysmal atrial fibrillation (I48.0) Story: Pacemaker interogation 01/08/2014: One episode of 3.5 hour A. Fib with no recurrence.  CHA2DS2-VASc Score is 3 with  yearly risk of stroke of 3.2 %. Bleeding risk is 1%/year. Rec: [place] Sick sinus syndrome (I49.5) Story: BMP 08/04/2012: BUN 12, serum creatinine 0.67, eGFR 86 mL. Hemoglobin 11.7/hematocrit 38.5, microcytic indicis. Platelet count 101,000. Impression: EKG 09/12/2013: Demand a paced, V paced rhythm @ 66/min. No further analysis. Cardiac pacemaker in situ (Z95.0) Story: St Jude pulse generator(Has no remote transmission capabality). Serial number [id number]: Dr. Olin Christensen. on 08/05/2013. Impression: In-house pacemaker  check 08/19/2014: Underlying sinus @ 60/min. V- Paced 100%. Underlying complete heart block with ventricular escape at 30bpm. Brief high rates with longest 16 seconds. Normal pacermaker function. A- Pace 68% and V- Paced 50%.  In-house pacemaker check 01/08/2014: Normal pacemaker function. 8. Mode switches since September 2015. Longest 3 hours and 35 minutes. Other episodes less than 30 seconds. V- paced 100%, A- paced 0%. Pacer dependant.  08/20/13: Pacer check: 2 mode switches, <1 minute. Current Plans Mechanism of underlying disease process and action of medications discussed with the patient. I discussed primary/secondary prevention and also dietary counceling was done. She is presently doing well presents here for follow-up and management of critical limb ischemia, I have reviewed the results of the lower extent arterial duplex with patient's cousin at the bedside and although 51 years of age him up patient is functional and very smart and has excellent memory and in pain. To ovoid limb loss, improve quality of life due to pain, I have recommended that he proceed with arteriogram. High risk for death, perforation, bleeding, renal failure was discussed with the patient and her niece. They understand the risk and are willing to proceed. Addendum Note(Bridgette Allison AGNP-C; 10/09/2014 1:27 PM) Labs 10/08/2014: Serum glucose 143, creatinine 0.75, potassium 4.0, H&H normal with slightly microcytic  indices, PT 9.9, INR 1.0     Signed by Morgan Page, MD (10/01/2014 4:34 PM)

## 2014-10-13 ENCOUNTER — Ambulatory Visit (HOSPITAL_COMMUNITY)
Admission: RE | Admit: 2014-10-13 | Discharge: 2014-10-13 | Disposition: A | Discharge status: Home / Self Care | Payer: Medicare Other | Source: Ambulatory Visit | Attending: Cardiology | Admitting: Cardiology

## 2014-10-13 ENCOUNTER — Encounter (HOSPITAL_COMMUNITY)
Admission: RE | Disposition: A | Discharge status: Home / Self Care | Payer: Self-pay | Source: Ambulatory Visit | Attending: Cardiology

## 2014-10-13 ENCOUNTER — Encounter (HOSPITAL_COMMUNITY): Payer: Self-pay | Admitting: Cardiology

## 2014-10-13 DIAGNOSIS — I480 Paroxysmal atrial fibrillation: Secondary | ICD-10-CM | POA: Insufficient documentation

## 2014-10-13 DIAGNOSIS — Z79899 Other long term (current) drug therapy: Secondary | ICD-10-CM | POA: Diagnosis not present

## 2014-10-13 DIAGNOSIS — I70245 Atherosclerosis of native arteries of left leg with ulceration of other part of foot: Secondary | ICD-10-CM | POA: Diagnosis not present

## 2014-10-13 DIAGNOSIS — I739 Peripheral vascular disease, unspecified: Secondary | ICD-10-CM | POA: Diagnosis present

## 2014-10-13 DIAGNOSIS — I495 Sick sinus syndrome: Secondary | ICD-10-CM | POA: Diagnosis not present

## 2014-10-13 DIAGNOSIS — Z950 Presence of cardiac pacemaker: Secondary | ICD-10-CM | POA: Insufficient documentation

## 2014-10-13 DIAGNOSIS — E785 Hyperlipidemia, unspecified: Secondary | ICD-10-CM | POA: Insufficient documentation

## 2014-10-13 DIAGNOSIS — Z87891 Personal history of nicotine dependence: Secondary | ICD-10-CM | POA: Diagnosis not present

## 2014-10-13 DIAGNOSIS — I70229 Atherosclerosis of native arteries of extremities with rest pain, unspecified extremity: Secondary | ICD-10-CM

## 2014-10-13 DIAGNOSIS — I70221 Atherosclerosis of native arteries of extremities with rest pain, right leg: Secondary | ICD-10-CM | POA: Insufficient documentation

## 2014-10-13 DIAGNOSIS — I998 Other disorder of circulatory system: Secondary | ICD-10-CM

## 2014-10-13 DIAGNOSIS — E039 Hypothyroidism, unspecified: Secondary | ICD-10-CM | POA: Insufficient documentation

## 2014-10-13 DIAGNOSIS — M810 Age-related osteoporosis without current pathological fracture: Secondary | ICD-10-CM | POA: Insufficient documentation

## 2014-10-13 DIAGNOSIS — Z7901 Long term (current) use of anticoagulants: Secondary | ICD-10-CM | POA: Diagnosis not present

## 2014-10-13 HISTORY — PX: PERIPHERAL VASCULAR CATHETERIZATION: SHX172C

## 2014-10-13 LAB — POCT ACTIVATED CLOTTING TIME
ACTIVATED CLOTTING TIME: 159 s
Activated Clotting Time: 189 seconds

## 2014-10-13 SURGERY — LOWER EXTREMITY INTERVENTION
Anesthesia: LOCAL

## 2014-10-13 MED ORDER — HEPARIN SODIUM (PORCINE) 1000 UNIT/ML IJ SOLN
INTRAMUSCULAR | Status: AC
  Filled 2014-10-13: qty 1

## 2014-10-13 MED ORDER — FENTANYL CITRATE (PF) 100 MCG/2ML IJ SOLN
INTRAMUSCULAR | Status: AC
  Filled 2014-10-13: qty 2

## 2014-10-13 MED ORDER — HEPARIN (PORCINE) IN NACL 2-0.9 UNIT/ML-% IJ SOLN
INTRAMUSCULAR | Status: AC
  Filled 2014-10-13: qty 1000

## 2014-10-13 MED ORDER — FENTANYL CITRATE (PF) 100 MCG/2ML IJ SOLN
INTRAMUSCULAR | Status: AC
  Filled 2014-10-13: qty 4

## 2014-10-13 MED ORDER — SODIUM CHLORIDE 0.9 % IV SOLN: INTRAVENOUS | Status: DC

## 2014-10-13 MED ORDER — IODIXANOL 320 MG/ML IV SOLN
INTRAVENOUS | Status: DC | PRN
  Administered 2014-10-13: 100 mL via INTRAVENOUS

## 2014-10-13 MED ORDER — LIDOCAINE HCL (PF) 1 % IJ SOLN
INTRAMUSCULAR | Status: AC
  Filled 2014-10-13: qty 30

## 2014-10-13 MED ORDER — LIDOCAINE HCL (PF) 1 % IJ SOLN
INTRAMUSCULAR | Status: DC | PRN
  Administered 2014-10-13: 300 mL

## 2014-10-13 MED ORDER — FENTANYL CITRATE (PF) 100 MCG/2ML IJ SOLN
INTRAMUSCULAR | Status: DC | PRN
  Administered 2014-10-13 (×2): 25 ug via INTRAVENOUS

## 2014-10-13 MED ORDER — HEPARIN SODIUM (PORCINE) 1000 UNIT/ML IJ SOLN
INTRAMUSCULAR | Status: DC | PRN
  Administered 2014-10-13: 4000 [IU] via INTRAVENOUS

## 2014-10-13 MED ORDER — SODIUM CHLORIDE 0.9 % IV BOLUS (SEPSIS)
500.0000 mL | Freq: Once | INTRAVENOUS | Status: AC
  Administered 2014-10-13: 500 mL via INTRAVENOUS

## 2014-10-13 MED ORDER — FENTANYL CITRATE (PF) 100 MCG/2ML IJ SOLN
25.0000 ug | Freq: Once | INTRAMUSCULAR | Status: AC
  Administered 2014-10-13: 25 ug via INTRAVENOUS

## 2014-10-13 MED ORDER — APIXABAN 2.5 MG PO TABS: 2.5000 mg | ORAL_TABLET | Freq: Two times a day (BID) | ORAL | Status: AC

## 2014-10-13 MED ORDER — SODIUM CHLORIDE 0.9 % IV SOLN: 1.0000 mL/kg/h | INTRAVENOUS | Status: DC

## 2014-10-13 SURGICAL SUPPLY — 18 items
CATH OMNI FLUSH 5F 65CM (CATHETERS) ×3 IMPLANT
CATH SOFT-VU 4F 65 STRAIGHT (CATHETERS) ×2 IMPLANT
CATH SOFT-VU STRAIGHT 4F 65CM (CATHETERS) ×1
CATH STRAIGHT 5FR 65CM (CATHETERS) ×3 IMPLANT
COVER PRB 48X5XTLSCP FOLD TPE (BAG) ×2 IMPLANT
COVER PROBE 5X48 (BAG) ×1
GUIDEWIRE ANGLED .035X150CM (WIRE) ×3 IMPLANT
KIT MICROINTRODUCER STIFF 5F (SHEATH) ×3 IMPLANT
KIT PV (KITS) ×3 IMPLANT
SHEATH BRITE TIP 7FR 35CM (SHEATH) ×3 IMPLANT
SHEATH PINNACLE 5F 10CM (SHEATH) ×6 IMPLANT
SYRINGE MEDRAD AVANTA MACH 7 (SYRINGE) ×3 IMPLANT
TRANSDUCER W/STOPCOCK (MISCELLANEOUS) ×3 IMPLANT
TRAY PV CATH (CUSTOM PROCEDURE TRAY) ×3 IMPLANT
TUBING CIL FLEX 10 FLL-RA (TUBING) ×3 IMPLANT
WIRE ASAHI MIRACLEBROS12 180CM (WIRE) ×3 IMPLANT
WIRE HITORQ VERSACORE ST 145CM (WIRE) ×3 IMPLANT
WIRE ROSEN-J .035X180CM (WIRE) ×3 IMPLANT

## 2014-10-13 NOTE — Discharge Instructions (Signed)

## 2014-10-13 NOTE — Interval H&P Note (Signed)
History and Physical Interval Note:  10/13/2014 7:45 AM  Riko Lumsden  has presented today for surgery, with the diagnosis of claudication  The various methods of treatment have been discussed with the patient and family. After consideration of risks, benefits and other options for treatment, the patient has consented to  Procedure(s): Lower Extremity Intervention (N/A) and possible  PTA as a surgical intervention .  The patient's history has been reviewed, patient examined, no change in status, stable for surgery.  I have reviewed the patient's chart and labs.  Questions were answered to the patient's satisfaction.     Yates Decamp

## 2014-10-13 NOTE — Progress Notes (Signed)
Site area: left groin a 5 french arterial sheath was remove  Site Prior to Removal:  Level 0  Pressure Applied For 15 MINUTES    Minutes Beginning at 1040a  Manual:   Yes.    Patient Status During Pull:  stable  Post Pull Groin Site:  Level 0  Post Pull Instructions Given:  Yes.    Post Pull Pulses Present:  Yes.    Dressing Applied:  Yes.    Comments:  VS remain stable during sheath pull.  Pain meds effective.

## 2014-10-13 NOTE — Progress Notes (Signed)
Site area: Right groin a 5 french arterial sheath was removed  Site Prior to Removal:  Level 0  Pressure Applied For 20 MINUTES    Minutes Beginning at 1035p  Manual:   Yes.    Patient Status During Pull:  stable  Post Pull Groin Site:  Level 0  Post Pull Instructions Given:  Yes.    Post Pull Pulses Present:  Yes.    Dressing Applied:  Yes.    Comments:  VS remain stable during sheath pull.

## 2016-12-17 ENCOUNTER — Emergency Department (HOSPITAL_COMMUNITY)
Admission: EM | Admit: 2016-12-17 | Discharge: 2016-12-17 | Disposition: A | Discharge status: Home / Self Care | Payer: Medicare Other | Attending: Emergency Medicine | Admitting: Emergency Medicine

## 2016-12-17 ENCOUNTER — Encounter (HOSPITAL_COMMUNITY): Payer: Self-pay

## 2016-12-17 ENCOUNTER — Emergency Department (HOSPITAL_COMMUNITY): Payer: Medicare Other

## 2016-12-17 DIAGNOSIS — Y9384 Activity, sleeping: Secondary | ICD-10-CM | POA: Insufficient documentation

## 2016-12-17 DIAGNOSIS — Y92122 Bedroom in nursing home as the place of occurrence of the external cause: Secondary | ICD-10-CM | POA: Diagnosis not present

## 2016-12-17 DIAGNOSIS — E039 Hypothyroidism, unspecified: Secondary | ICD-10-CM | POA: Insufficient documentation

## 2016-12-17 DIAGNOSIS — Z7901 Long term (current) use of anticoagulants: Secondary | ICD-10-CM | POA: Diagnosis not present

## 2016-12-17 DIAGNOSIS — W06XXXA Fall from bed, initial encounter: Secondary | ICD-10-CM | POA: Diagnosis not present

## 2016-12-17 DIAGNOSIS — S0990XA Unspecified injury of head, initial encounter: Secondary | ICD-10-CM | POA: Diagnosis present

## 2016-12-17 DIAGNOSIS — Z79899 Other long term (current) drug therapy: Secondary | ICD-10-CM | POA: Insufficient documentation

## 2016-12-17 DIAGNOSIS — Y999 Unspecified external cause status: Secondary | ICD-10-CM | POA: Insufficient documentation

## 2016-12-17 DIAGNOSIS — S0003XA Contusion of scalp, initial encounter: Secondary | ICD-10-CM

## 2016-12-17 DIAGNOSIS — Z950 Presence of cardiac pacemaker: Secondary | ICD-10-CM | POA: Diagnosis not present

## 2016-12-17 DIAGNOSIS — I10 Essential (primary) hypertension: Secondary | ICD-10-CM | POA: Diagnosis not present

## 2016-12-17 NOTE — ED Notes (Signed)
Bed: WA21 Expected date:  Expected time:  Means of arrival:  Comments: Fall no injury, needs to be checked, requested by facility

## 2016-12-17 NOTE — ED Triage Notes (Signed)
[facility] pt rolled out bed on to floor 1200. Pt is not complaining of pain. [facility] wanted patient to be evaluated due to Hematoma on right front side of head. Pt is not on any blood thinners. (Per EMS)

## 2016-12-17 NOTE — ED Notes (Signed)
Contacted Non-emergent Number [phone] [facility]. POC- Darl PikesSusan Pt is put on [facility]

## 2016-12-17 NOTE — ED Notes (Signed)
Contacted Dispatch to cancel PTAR. Pt will be leaving with Morgan Christensen her POC.
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
## 2017-03-12 ENCOUNTER — Ambulatory Visit: Payer: Medicare (Managed Care) | Admitting: Podiatry

## 2018-05-08 NOTE — Progress Notes (Deleted)
Subjective:  Primary Physician:  Patient, No Pcp Per  Patient ID: Morgan Christensen, female    DOB: [birth year], 85 y.o.   MRN: [id number]  No chief complaint on file.   HPI: Morgan Christensen  is a 85 y.o. female  with ***     Past Medical History:  Diagnosis Date  . Gait abnormality   . Hearing loss   . Hyperlipidemia   . Hypothyroidism   . Inflammatory arthropathy   . Mobitz type 2 second degree atrioventricular block 08/03/2013  . Osteoporosis     Past Surgical History:  Procedure Laterality Date  . PACEMAKER INSERTION  08-05-2013   STJ dual chamber pacemaker implanted by Dr Graciela Husbands for intermittent high grade heart block  . PERIPHERAL VASCULAR CATHETERIZATION Bilateral 10/13/2014   Procedure: Lower Extremity Intervention;  Surgeon: Yates Decamp, MD;  Location[facility] INVASIVE CV LAB;  Service: Cardiovascular;  Laterality: Bilateral;  . PERIPHERAL VASCULAR CATHETERIZATION N/A 10/13/2014   Procedure: Abdominal Aortogram;  Surgeon: Yates Decamp, MD;  Location: [facility] INVASIVE CV LAB;  Service: Cardiovascular;  Laterality: N/A;  . PERMANENT PACEMAKER INSERTION N/A 08/05/2013   Procedure: PERMANENT PACEMAKER INSERTION;  Surgeon: Duke Salvia, MD;  Location: [facility] CATH LAB;  Service: Cardiovascular;  Laterality: N/A;    Social History   Socioeconomic History  . Marital status: Widowed    Spouse name: Not on file  . Number of children: Not on file  . Years of education: Not on file  . Highest education level: Not on file  Occupational History  . Not on file  Social Needs  . Financial resource strain: Not on file  . Food insecurity:    Worry: Not on file    Inability: Not on file  . Transportation needs:    Medical: Not on file    Non-medical: Not on file  Tobacco Use  . Smoking status: Never Smoker  . Smokeless tobacco: Never Used  Substance and Sexual Activity  . Alcohol use: No    Alcohol/week: 0.0 standard drinks  . Drug use: No  . Sexual activity: Not Currently  Lifestyle  .  Physical activity:    Days per week: Not on file    Minutes per session: Not on file  . Stress: Not on file  Relationships  . Social connections:    Talks on phone: Not on file    Gets together: Not on file    Attends religious service: Not on file    Active member of club or organization: Not on file    Attends meetings of clubs or organizations: Not on file    Relationship status: Not on file  . Intimate partner violence:    Fear of current or ex partner: Not on file    Emotionally abused: Not on file    Physically abused: Not on file    Forced sexual activity: Not on file  Other Topics Concern  . Not on file  Social History Narrative  . Not on file    Current Outpatient Medications on File Prior to Visit  Medication Sig Dispense Refill  . acetaminophen (TYLENOL) 650 MG CR tablet Take 650 mg by mouth every morning.    Marland Kitchen apixaban (ELIQUIS) 2.5 MG TABS tablet Take 1 tablet (2.5 mg total) by mouth 2 (two) times daily. 60 tablet   . calcium-vitamin D (OSCAL WITH D) 500-200 MG-UNIT per tablet Take 1 tablet by mouth every morning.     . Cholecalciferol 2000 UNITS TABS Take 2,000  Units by mouth every morning.     . docusate calcium (SURFAK) 240 MG capsule Take 240 mg by mouth at bedtime.     . gabapentin (NEURONTIN) 100 MG capsule Take 100 mg by mouth 3 (three) times daily.    . hydrochlorothiazide (MICROZIDE) 12.5 MG capsule Take 12.5 mg by mouth every morning.    Marland Kitchen levothyroxine (SYNTHROID, LEVOTHROID) 75 MCG tablet Take 75 mcg by mouth daily before breakfast.    . metoprolol succinate (TOPROL XL) 50 MG 24 hr tablet Take 50 mg by mouth every morning.     . mometasone (NASONEX) 50 MCG/ACT nasal spray Place 2 sprays into the nose daily.    . pantoprazole (PROTONIX) 40 MG tablet Take 40 mg by mouth daily before breakfast.     . polyethylene glycol (MIRALAX / GLYCOLAX) packet Take 17 g by mouth every morning.     . sertraline (ZOLOFT) 25 MG tablet Take 25 mg by mouth daily.    .  simvastatin (ZOCOR) 10 MG tablet Take 10 mg by mouth every evening.    . traMADol (ULTRAM) 50 MG tablet Take 50 mg by mouth every 6 (six) hours as needed for moderate pain.     No current facility-administered medications on file prior to visit.     ***Review of Systems  Constitutional: Negative for malaise/fatigue and weight loss.  Respiratory: Negative for cough, hemoptysis and shortness of breath.   Cardiovascular: Negative for chest pain, palpitations, claudication and leg swelling.  Gastrointestinal: Negative for abdominal pain, blood in stool, constipation, heartburn and vomiting.  Genitourinary: Negative for dysuria.  Musculoskeletal: Negative for joint pain and myalgias.  Neurological: Negative for dizziness, focal weakness and headaches.  Endo/Heme/Allergies: Does not bruise/bleed easily.  Psychiatric/Behavioral: Negative for depression. The patient is not nervous/anxious.   All other systems reviewed and are negative.      Objective:  There were no vitals taken for this visit. There is no height or weight on file to calculate BMI.  ***Physical Exam  Constitutional: She appears well-developed and well-nourished. No distress.  HENT:  Head: Atraumatic.  Eyes: Conjunctivae are normal.  Neck: Neck supple. No JVD present. No thyromegaly present.  Cardiovascular: Normal rate, regular rhythm, normal heart sounds and intact distal pulses. Exam reveals no gallop.  No murmur heard. Pulmonary/Chest: Effort normal and breath sounds normal.  Abdominal: Soft. Bowel sounds are normal.  Musculoskeletal: Normal range of motion.        General: No edema.  Neurological: She is alert.  Skin: Skin is warm and dry.  Psychiatric: She has a normal mood and affect.    CARDIAC STUDIES:   *** CT Head WO contrast 12/17/2016: 1. No acute intracranial process identified. 2. Opacification of left maxillary sinus.  PV angio 10/13/2014: Left Common iliac occluded. Left Pop 99%, Diffuse calcific  disease.3 vessel runoff  at left ankle. Right distal SFA 99% stenosis, diffuse calcific disease. Right PT occluded distally, 2 vessel runoff at ankle.  Abdominal aortogram: Heavily calcified and significant atherosclerotic changes, 2 renal arteries one on either sides, widely patent.    Recent Labs:  ***  CMP Latest Ref Rng & Units 08/04/2013 08/03/2013 08/03/2013  Glucose 70 - 99 mg/dL 87 175(Z) 025(E)  BUN 6 - 23 mg/dL 12 13 14   Creatinine 0.50 - 1.10 mg/dL 5.27 7.82 4.23  Sodium 137 - 147 mEq/L 138 139 140  Potassium 3.7 - 5.3 mEq/L 4.0 4.2 4.6  Chloride 96 - 112 mEq/L 100 101 102  CO2 19 -  32 mEq/L 22 - 24  Calcium 8.4 - 10.5 mg/dL 9.4 - 9.7  Total Protein 6.0 - 8.3 g/dL - - 7.0  Total Bilirubin 0.3 - 1.2 mg/dL - - <1.6(X)  Alkaline Phos 39 - 117 U/L - - 59  AST 0 - 37 U/L - - 17  ALT 0 - 35 U/L - - 8     Assessment & Recommendations:   There are no diagnoses linked to this encounter.  Recommendation: ***   Yates Decamp, MD, [facility] 05/08/2018, 2:38 PM [facility] Pager: [phone] Office: [phone] If no answer Cell [phone]

## 2018-05-09 ENCOUNTER — Telehealth: Payer: Self-pay

## 2018-05-09 ENCOUNTER — Ambulatory Visit: Payer: Self-pay | Admitting: Cardiology

## 2019-07-11 IMAGING — CT CT HEAD W/O CM
3 of 4 series · 14 of 47 positions shown, 16 images · non-contrast
Comparison: August 03, 2013

CLINICAL DATA: Pain after fall.

EXAM:
CT HEAD WITHOUT CONTRAST
TECHNIQUE: Contiguous axial images were obtained from the base of the skull
through the vertex without intravenous contrast.

[Series 2: head w/o · axial · non-contrast · 0.47mm/px · z∈[+1279,+1399]mm · 8 of 30 slices shown, 10 images]
[im 3/30  brain]
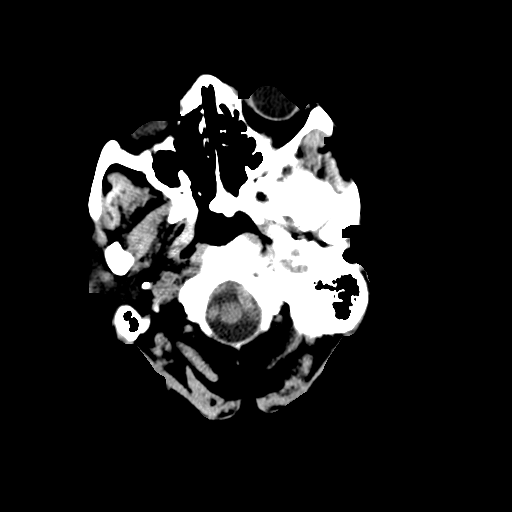
[im 3/30  bone]
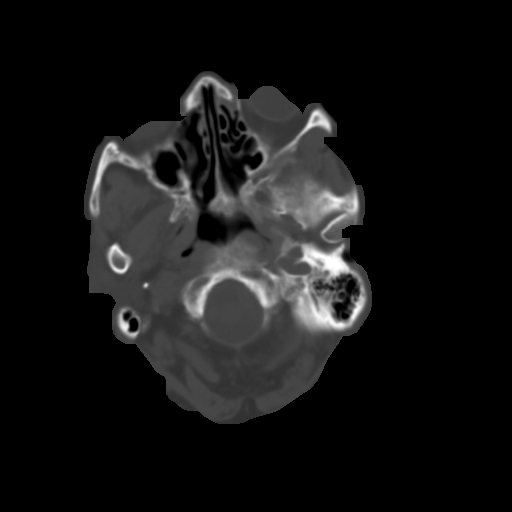
[im 7/30  brain]
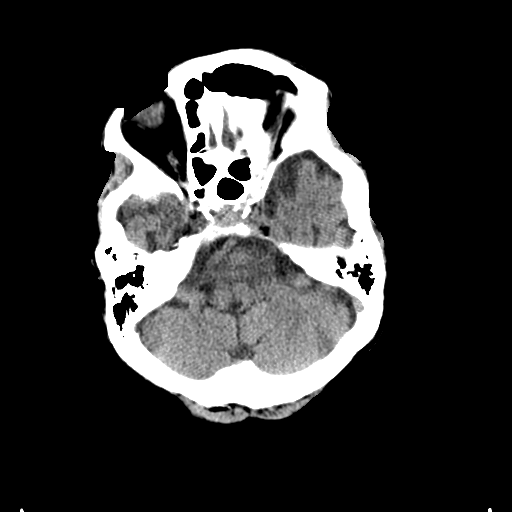
[im 11/30  brain]
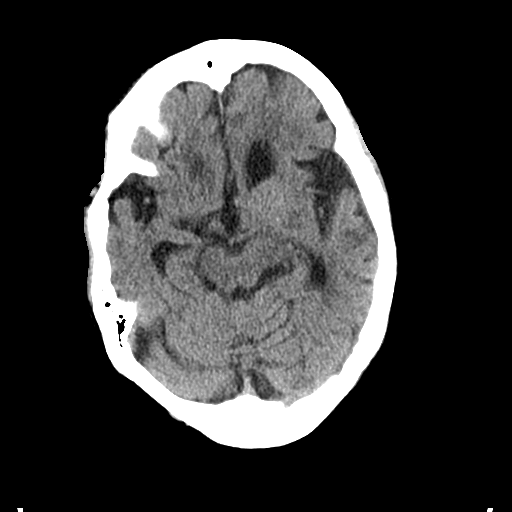
[im 13/30  brain]
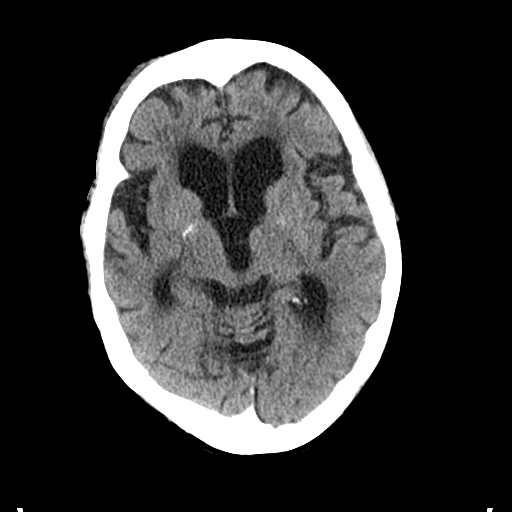
[im 17/30  brain]
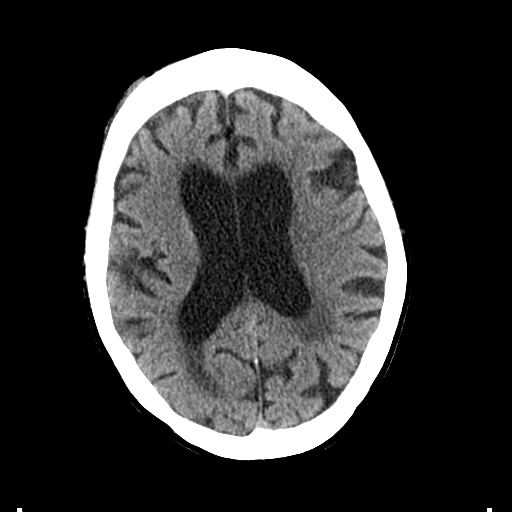
[im 17/30  bone]
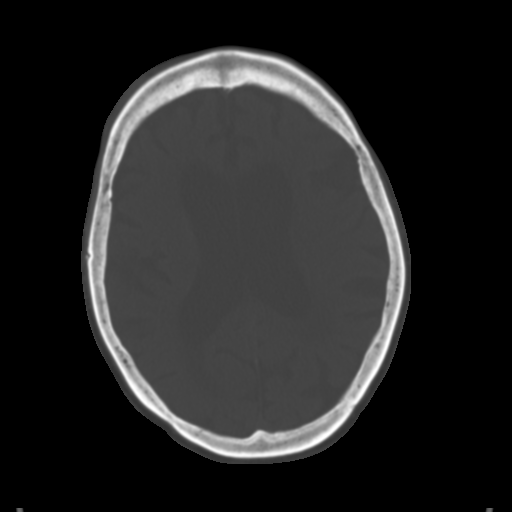
[im 19/30  brain]
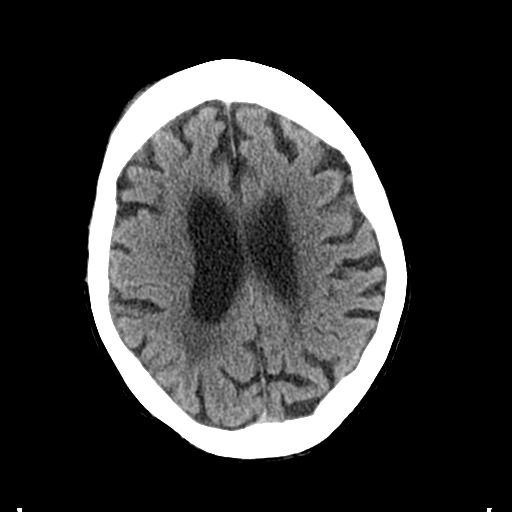
[im 23/30  brain]
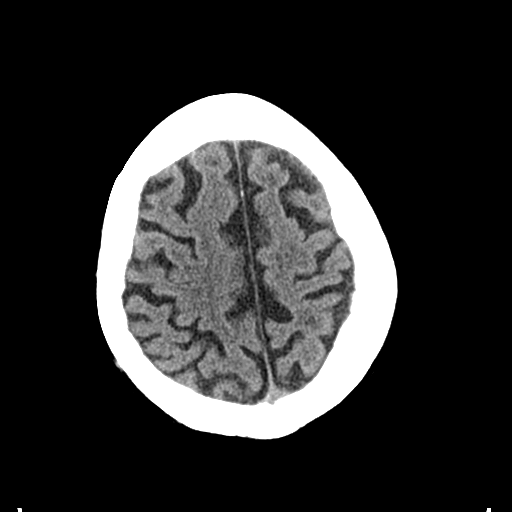
[im 27/30  brain]
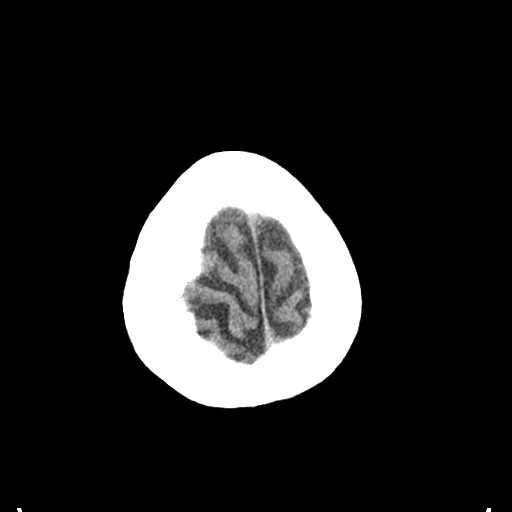

[Series 4: coronal · coronal · 0.29mm/px · 3 of 77 slices shown]
[im 26/77  brain]
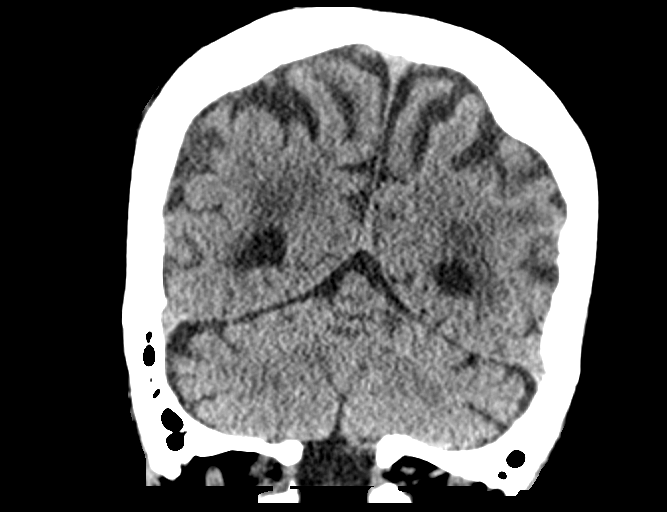
[im 34/77  brain]
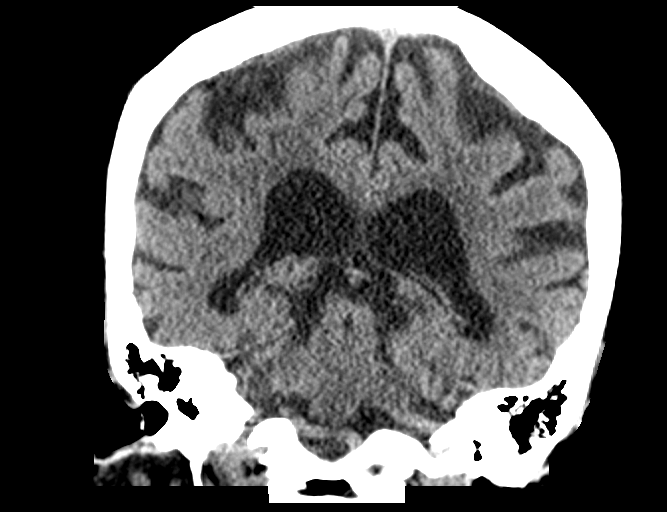
[im 43/77  brain]
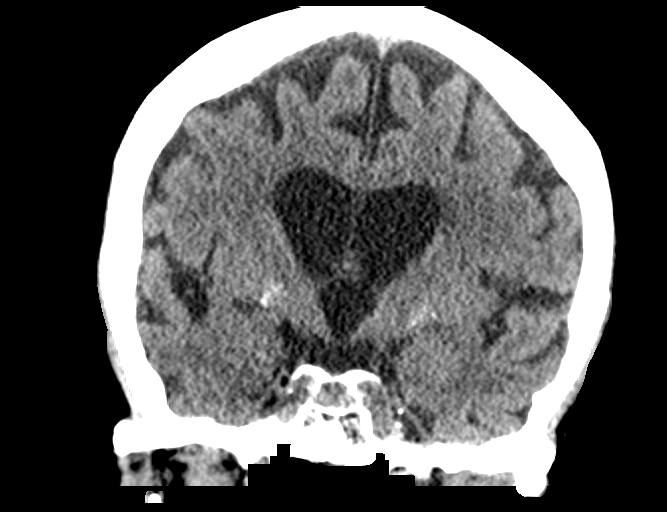

[Series 5: sagittal · sagittal · 0.32mm/px · 3 of 70 slices shown]
[im 24/70  brain]
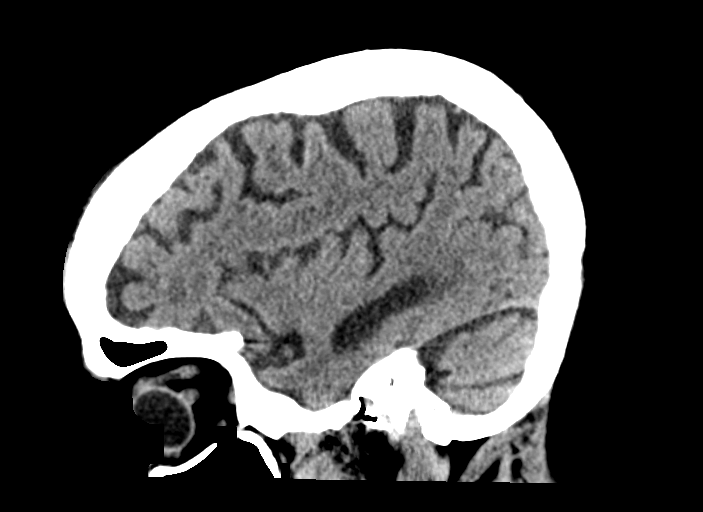
[im 35/70  brain]
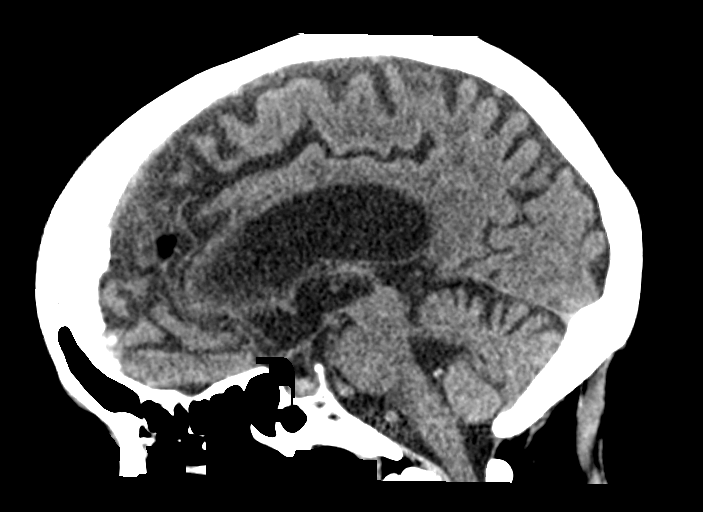
[im 47/70  brain]
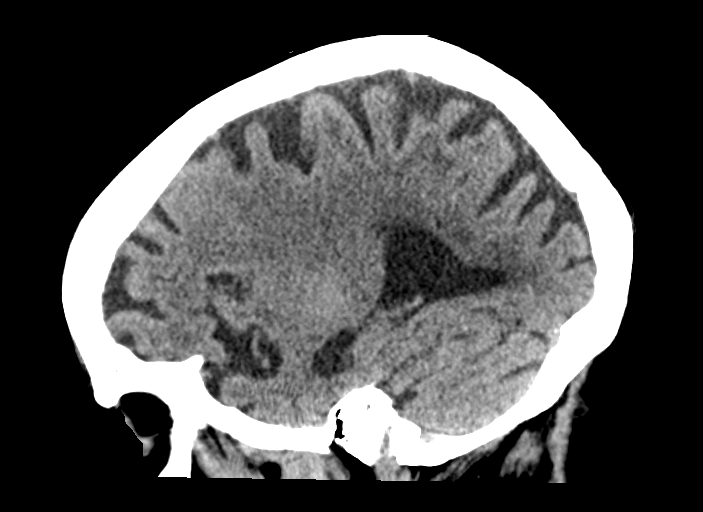

[14 of 47 positions shown; findings below may reference images not displayed]

FINDINGS: Brain: No subdural, epidural, or subarachnoid hemorrhage. Ventricles
and sulci are prominent but similar in the interval. White matter
changes are unchanged. No acute cortical ischemia or infarct. No
mass effect or midline shift. Cerebellum, brainstem, and basal
cisterns are normal.

Vascular: Intracranial carotid atherosclerosis identified.

Skull: No definitive fractures are seen.

Sinuses/Orbits: There is opacification of the left maxillary sinus.
Paranasal sinuses, mastoid air cells, and middle ears are otherwise
normal.

Other: Mild skin thickening over the right supraorbital region
without a large hematoma. Both globes are intact. Extracranial soft
tissues otherwise normal.
IMPRESSION: 1. No acute intracranial process identified.
2. Opacification of left maxillary sinus.

## 2019-11-21 DEATH — deceased
